# Patient Record
Sex: Female | Born: 1945 | Race: White | Hispanic: No | Smoking: Former smoker
Health system: Southern US, Community
[De-identification: ages and names within clinical notes are randomized; demographics above are authoritative.]

## PROBLEM LIST (undated history)

## (undated) DIAGNOSIS — M1990 Unspecified osteoarthritis, unspecified site: Secondary | ICD-10-CM

## (undated) DIAGNOSIS — E079 Disorder of thyroid, unspecified: Secondary | ICD-10-CM

## (undated) DIAGNOSIS — S72409A Unspecified fracture of lower end of unspecified femur, initial encounter for closed fracture: Secondary | ICD-10-CM

## (undated) DIAGNOSIS — J449 Chronic obstructive pulmonary disease, unspecified: Secondary | ICD-10-CM

## (undated) DIAGNOSIS — F419 Anxiety disorder, unspecified: Secondary | ICD-10-CM

## (undated) DIAGNOSIS — I10 Essential (primary) hypertension: Secondary | ICD-10-CM

## (undated) DIAGNOSIS — S72309A Unspecified fracture of shaft of unspecified femur, initial encounter for closed fracture: Secondary | ICD-10-CM

## (undated) DIAGNOSIS — K219 Gastro-esophageal reflux disease without esophagitis: Secondary | ICD-10-CM

## (undated) DIAGNOSIS — R51 Headache: Secondary | ICD-10-CM

## (undated) DIAGNOSIS — R0602 Shortness of breath: Secondary | ICD-10-CM

## (undated) DIAGNOSIS — E039 Hypothyroidism, unspecified: Secondary | ICD-10-CM

---

## 2001-01-01 ENCOUNTER — Emergency Department (HOSPITAL_COMMUNITY): Admission: EM | Admit: 2001-01-01 | Discharge: 2001-01-01 | Payer: Self-pay | Admitting: Emergency Medicine

## 2001-02-04 ENCOUNTER — Encounter: Admission: RE | Admit: 2001-02-04 | Discharge: 2001-02-04 | Payer: Self-pay | Admitting: Cardiovascular Disease

## 2001-02-04 ENCOUNTER — Encounter: Payer: Self-pay | Admitting: Cardiovascular Disease

## 2002-11-04 ENCOUNTER — Encounter: Admission: RE | Admit: 2002-11-04 | Discharge: 2002-11-04 | Payer: Self-pay

## 2004-09-20 ENCOUNTER — Ambulatory Visit: Payer: Self-pay | Admitting: Family Medicine

## 2005-01-22 ENCOUNTER — Ambulatory Visit: Payer: Self-pay | Admitting: Gastroenterology

## 2005-01-22 ENCOUNTER — Inpatient Hospital Stay (HOSPITAL_COMMUNITY): Admission: EM | Admit: 2005-01-22 | Discharge: 2005-01-25 | Payer: Self-pay | Admitting: Emergency Medicine

## 2005-02-07 ENCOUNTER — Ambulatory Visit: Payer: Self-pay | Admitting: Family Medicine

## 2005-02-09 ENCOUNTER — Ambulatory Visit: Payer: Self-pay | Admitting: Family Medicine

## 2005-02-15 ENCOUNTER — Ambulatory Visit: Payer: Self-pay | Admitting: Gastroenterology

## 2005-02-21 ENCOUNTER — Ambulatory Visit: Payer: Self-pay | Admitting: Family Medicine

## 2005-03-03 ENCOUNTER — Ambulatory Visit: Payer: Self-pay | Admitting: Gastroenterology

## 2005-05-04 ENCOUNTER — Ambulatory Visit: Payer: Self-pay | Admitting: Cardiology

## 2005-05-04 ENCOUNTER — Emergency Department (HOSPITAL_COMMUNITY): Admission: EM | Admit: 2005-05-04 | Discharge: 2005-05-04 | Payer: Self-pay | Admitting: Emergency Medicine

## 2005-05-04 ENCOUNTER — Ambulatory Visit: Payer: Self-pay | Admitting: Family Medicine

## 2005-05-05 ENCOUNTER — Ambulatory Visit: Payer: Self-pay | Admitting: Gastroenterology

## 2005-05-11 ENCOUNTER — Ambulatory Visit: Payer: Self-pay | Admitting: Cardiology

## 2005-05-16 ENCOUNTER — Ambulatory Visit: Payer: Self-pay | Admitting: Gastroenterology

## 2005-05-24 ENCOUNTER — Ambulatory Visit: Payer: Self-pay | Admitting: Cardiology

## 2005-06-16 ENCOUNTER — Ambulatory Visit: Payer: Self-pay | Admitting: Cardiology

## 2005-10-12 ENCOUNTER — Ambulatory Visit: Payer: Self-pay | Admitting: Family Medicine

## 2005-11-23 ENCOUNTER — Ambulatory Visit: Payer: Self-pay | Admitting: Family Medicine

## 2006-07-11 ENCOUNTER — Ambulatory Visit: Payer: Self-pay | Admitting: Family Medicine

## 2006-09-28 ENCOUNTER — Ambulatory Visit: Payer: Self-pay | Admitting: Family Medicine

## 2013-06-26 ENCOUNTER — Emergency Department (HOSPITAL_COMMUNITY): Payer: Medicare Other

## 2013-06-26 ENCOUNTER — Encounter (HOSPITAL_COMMUNITY): Payer: Self-pay | Admitting: Emergency Medicine

## 2013-06-26 ENCOUNTER — Inpatient Hospital Stay (HOSPITAL_COMMUNITY)
Admission: EM | Admit: 2013-06-26 | Discharge: 2013-07-01 | DRG: 481 | Disposition: A | Discharge status: Skilled Nursing Facility | Payer: Medicare Other | Attending: Internal Medicine | Admitting: Internal Medicine

## 2013-06-26 DIAGNOSIS — S72409S Unspecified fracture of lower end of unspecified femur, sequela: Secondary | ICD-10-CM

## 2013-06-26 DIAGNOSIS — Y92009 Unspecified place in unspecified non-institutional (private) residence as the place of occurrence of the external cause: Secondary | ICD-10-CM

## 2013-06-26 DIAGNOSIS — F39 Unspecified mood [affective] disorder: Secondary | ICD-10-CM | POA: Diagnosis present

## 2013-06-26 DIAGNOSIS — Y93E9 Activity, other interior property and clothing maintenance: Secondary | ICD-10-CM

## 2013-06-26 DIAGNOSIS — Z87891 Personal history of nicotine dependence: Secondary | ICD-10-CM

## 2013-06-26 DIAGNOSIS — Z880 Allergy status to penicillin: Secondary | ICD-10-CM

## 2013-06-26 DIAGNOSIS — W010XXA Fall on same level from slipping, tripping and stumbling without subsequent striking against object, initial encounter: Secondary | ICD-10-CM | POA: Diagnosis present

## 2013-06-26 DIAGNOSIS — M129 Arthropathy, unspecified: Secondary | ICD-10-CM | POA: Diagnosis present

## 2013-06-26 DIAGNOSIS — K219 Gastro-esophageal reflux disease without esophagitis: Secondary | ICD-10-CM | POA: Diagnosis present

## 2013-06-26 DIAGNOSIS — I10 Essential (primary) hypertension: Secondary | ICD-10-CM | POA: Diagnosis present

## 2013-06-26 DIAGNOSIS — S72309A Unspecified fracture of shaft of unspecified femur, initial encounter for closed fracture: Secondary | ICD-10-CM | POA: Diagnosis present

## 2013-06-26 DIAGNOSIS — Z79899 Other long term (current) drug therapy: Secondary | ICD-10-CM

## 2013-06-26 DIAGNOSIS — S7292XA Unspecified fracture of left femur, initial encounter for closed fracture: Secondary | ICD-10-CM

## 2013-06-26 DIAGNOSIS — D62 Acute posthemorrhagic anemia: Secondary | ICD-10-CM | POA: Diagnosis not present

## 2013-06-26 DIAGNOSIS — Z882 Allergy status to sulfonamides status: Secondary | ICD-10-CM

## 2013-06-26 DIAGNOSIS — M949 Disorder of cartilage, unspecified: Secondary | ICD-10-CM

## 2013-06-26 DIAGNOSIS — M899 Disorder of bone, unspecified: Secondary | ICD-10-CM | POA: Diagnosis present

## 2013-06-26 DIAGNOSIS — A498 Other bacterial infections of unspecified site: Secondary | ICD-10-CM | POA: Diagnosis present

## 2013-06-26 DIAGNOSIS — IMO0002 Reserved for concepts with insufficient information to code with codable children: Secondary | ICD-10-CM

## 2013-06-26 DIAGNOSIS — E039 Hypothyroidism, unspecified: Secondary | ICD-10-CM | POA: Diagnosis present

## 2013-06-26 DIAGNOSIS — N390 Urinary tract infection, site not specified: Secondary | ICD-10-CM | POA: Diagnosis present

## 2013-06-26 DIAGNOSIS — J449 Chronic obstructive pulmonary disease, unspecified: Secondary | ICD-10-CM

## 2013-06-26 DIAGNOSIS — I824Y9 Acute embolism and thrombosis of unspecified deep veins of unspecified proximal lower extremity: Secondary | ICD-10-CM | POA: Diagnosis present

## 2013-06-26 DIAGNOSIS — J4489 Other specified chronic obstructive pulmonary disease: Secondary | ICD-10-CM | POA: Diagnosis present

## 2013-06-26 DIAGNOSIS — S72409A Unspecified fracture of lower end of unspecified femur, initial encounter for closed fracture: Secondary | ICD-10-CM

## 2013-06-26 DIAGNOSIS — Z23 Encounter for immunization: Secondary | ICD-10-CM

## 2013-06-26 DIAGNOSIS — S72453A Displaced supracondylar fracture without intracondylar extension of lower end of unspecified femur, initial encounter for closed fracture: Principal | ICD-10-CM | POA: Diagnosis present

## 2013-06-26 HISTORY — DX: Anxiety disorder, unspecified: F41.9

## 2013-06-26 HISTORY — DX: Gastro-esophageal reflux disease without esophagitis: K21.9

## 2013-06-26 HISTORY — DX: Unspecified fracture of lower end of unspecified femur, initial encounter for closed fracture: S72.409A

## 2013-06-26 HISTORY — DX: Unspecified fracture of shaft of unspecified femur, initial encounter for closed fracture: S72.309A

## 2013-06-26 HISTORY — DX: Disorder of thyroid, unspecified: E07.9

## 2013-06-26 HISTORY — DX: Headache: R51

## 2013-06-26 HISTORY — DX: Essential (primary) hypertension: I10

## 2013-06-26 HISTORY — DX: Unspecified osteoarthritis, unspecified site: M19.90

## 2013-06-26 HISTORY — DX: Chronic obstructive pulmonary disease, unspecified: J44.9

## 2013-06-26 HISTORY — DX: Hypothyroidism, unspecified: E03.9

## 2013-06-26 HISTORY — DX: Shortness of breath: R06.02

## 2013-06-26 MED ORDER — MORPHINE SULFATE 4 MG/ML IJ SOLN
4.0000 mg | INTRAMUSCULAR | Status: DC | PRN
  Filled 2013-06-26: qty 1

## 2013-06-26 MED ORDER — ONDANSETRON HCL 4 MG/2ML IJ SOLN
4.0000 mg | Freq: Once | INTRAMUSCULAR | Status: AC
  Administered 2013-06-26: 4 mg via INTRAVENOUS

## 2013-06-26 MED ORDER — ONDANSETRON HCL 4 MG/2ML IJ SOLN
4.0000 mg | Freq: Once | INTRAMUSCULAR | Status: DC
Start: 2013-06-26 — End: 2013-07-01
  Filled 2013-06-26: qty 2

## 2013-06-26 MED ORDER — ONDANSETRON HCL 4 MG/2ML IJ SOLN
4.0000 mg | Freq: Once | INTRAMUSCULAR | Status: AC
  Filled 2013-06-26: qty 2

## 2013-06-26 MED ORDER — ONDANSETRON HCL 4 MG/2ML IJ SOLN: 4.0000 mg | Freq: Four times a day (QID) | INTRAMUSCULAR | Status: DC | PRN

## 2013-06-26 NOTE — ED Provider Notes (Signed)
CSN: [id number]     Arrival date & time 06/26/13  2254 History   First MD Initiated Contact with Patient 06/26/13 2308     Chief Complaint  Patient presents with  . Knee Injury   HPI  History provided by the patient and significant other. Patient is a 68 year old female with history of hypertension and COPD who presents with left knee and leg pain and injury. Patient states that she was trying to clean up the mess in the home when she tripped and fell forward landing on her left knee. Since that time she has had significant pain and swelling to her knee. EMS was called and patient was transferred and given 4 mg of morphine. She did become nauseous with episode of vomiting following the medication and was given a dose of Zofran. She denies any other injury from the fall. No upper extremity injuries. No head injury or LOC. No back or neck pains. Denies any weakness or numbness in the foot or toes. Accident occurred around 10:30 PM.    Past Medical History  Diagnosis Date  . Hypertension   . Thyroid disease   . COPD (chronic obstructive pulmonary disease)    History reviewed. No pertinent past surgical history. History reviewed. No pertinent family history. History  Substance Use Topics  . Smoking status: Former Games developer  . Smokeless tobacco: Not on file  . Alcohol Use: No   OB History   Grav Para Term Preterm Abortions TAB SAB Ect Mult Living                 Review of Systems  All other systems reviewed and are negative.    Allergies  Penicillins and Sulfa antibiotics  Home Medications   Current Outpatient Rx  Name  Route  Sig  Dispense  Refill  . levothyroxine (SYNTHROID, LEVOTHROID) 25 MCG tablet   Oral   Take 25 mcg by mouth daily before breakfast.         . nebivolol (BYSTOLIC) 5 MG tablet   Oral   Take 2.5 mg by mouth daily.          . predniSONE (DELTASONE) 5 MG tablet   Oral   Take 5 mg by mouth daily with breakfast.         . sertraline (ZOLOFT) 50 MG  tablet   Oral   Take 50 mg by mouth daily.         Marland Kitchen tiotropium (SPIRIVA) 18 MCG inhalation capsule   Inhalation   Place 18 mcg into inhaler and inhale daily.          BP 150/89  Pulse 65  Temp(Src) 97.7 F (36.5 C) (Oral)  Resp 14  Ht 5\' 5"  (1.651 m)  Wt 140 lb (63.504 kg)  BMI 23.30 kg/m2  SpO2 96% Physical Exam  Nursing note and vitals reviewed. Constitutional: She is oriented to person, place, and time. She appears well-developed and well-nourished. No distress.  HENT:  Head: Normocephalic and atraumatic.  Neck: Normal range of motion. Neck supple.  Cardiovascular: Normal rate and regular rhythm.   Pulmonary/Chest: Effort normal and breath sounds normal. No respiratory distress. She has no wheezes. She has no rales.  Musculoskeletal:  Significant swelling around the left knee with pain and tenderness. Patient unable to have range of motion secondary to pain and discomfort. She has normal movements in the foot and ankle. Dorsal pedal pulses are difficult to palpate manually. Good capillary refill less than 2 seconds.  No pain  or tenderness to palpation of the left hip area.  Neurological: She is alert and oriented to person, place, and time.  Skin: Skin is warm and dry. No rash noted.  Psychiatric: She has a normal mood and affect. Her behavior is normal.    ED Course  Procedures   DIAGNOSTIC STUDIES: Oxygen Saturation is 96% on room air.    COORDINATION OF CARE:  Nursing notes reviewed. Vital signs reviewed. Initial pt interview and examination performed.   11:49 PM-patient seen and evaluated. She appears comfortable at this time no acute distress. Does complain of slight nausea.   X-rays reviewed showing fracture of the distal left femur. Will obtain preop tests as well as x-rays of the left hip. Patient discussed with attending physician who agrees with plan.  1:00 AM spoke with Dr. Eulah PontMurphy on call for orthopedics. Would like the leg straightened to reduce  the fracture as much as possible and patient placed in knee immobilizer. He'll plan on surgery later in the day. Would like hospitalist to admit. Patient to be n.p.o.  1:20 AM spoke with [facility] hospitalist. They will see patient and admit. Would like old and orders for a MedSurg bed. Patient's chest x-ray shows possibilities of infiltrate. Patient without any change cough, fever or congestion.  Results for orders placed during the hospital encounter of 06/26/13  CBC WITH DIFFERENTIAL      Result Value Range   WBC 9.7  4.0 - 10.5 K/uL   RBC 3.75 (*) 3.87 - 5.11 MIL/uL   Hemoglobin 13.2  12.0 - 15.0 g/dL   HCT 45.438.6  09.836.0 - 11.946.0 %   MCV 102.9 (*) 78.0 - 100.0 fL   MCH 35.2 (*) 26.0 - 34.0 pg   MCHC 34.2  30.0 - 36.0 g/dL   RDW 14.713.2  82.911.5 - 56.215.5 %   Platelets 183  150 - 400 K/uL   Neutrophils Relative % 84 (*) 43 - 77 %   Neutro Abs 8.1 (*) 1.7 - 7.7 K/uL   Lymphocytes Relative 11 (*) 12 - 46 %   Lymphs Abs 1.0  0.7 - 4.0 K/uL   Monocytes Relative 5  3 - 12 %   Monocytes Absolute 0.5  0.1 - 1.0 K/uL   Eosinophils Relative 1  0 - 5 %   Eosinophils Absolute 0.1  0.0 - 0.7 K/uL   Basophils Relative 0  0 - 1 %   Basophils Absolute 0.0  0.0 - 0.1 K/uL  BASIC METABOLIC PANEL      Result Value Range   Sodium 139  137 - 147 mEq/L   Potassium 4.3  3.7 - 5.3 mEq/L   Chloride 101  96 - 112 mEq/L   CO2 25  19 - 32 mEq/L   Glucose, Bld 126 (*) 70 - 99 mg/dL   BUN 21  6 - 23 mg/dL   Creatinine, Ser 1.300.99  0.50 - 1.10 mg/dL   Calcium 9.5  8.4 - 86.510.5 mg/dL   GFR calc non Af Amer 58 (*) >90 mL/min   GFR calc Af Amer 67 (*) >90 mL/min  PROTIME-INR      Result Value Range   Prothrombin Time 13.3  11.6 - 15.2 seconds   INR 1.03  0.00 - 1.49  APTT      Result Value Range   aPTT 23 (*) 24 - 37 seconds  TYPE AND SCREEN      Result Value Range   ABO/RH(D) A POS     Antibody  Screen NEG     Sample Expiration 06/30/2013        Imaging Review Dg Chest Portable 1 View  06/27/2013   CLINICAL DATA:   Fall with femur fracture.  EXAM: PORTABLE CHEST - 1 VIEW  COMPARISON:  05/04/2005  FINDINGS: Normal heart size and pulmonary vascularity. There appears to be infiltration or atelectasis in the left lung base obscuring the left hemidiaphragm. Pneumonia may be present. Pleural thickening in the left apex. 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  IMPRESSION: Focal infiltration in the left lung base may represent pneumonia.   Electronically Signed   By: Burman Nieves M.D.   On: 06/27/2013 00:46   Dg Hip Portable 1 View Left  06/27/2013   CLINICAL DATA:  Fall.  Distal femur fracture.  EXAM: PORTABLE LEFT HIP - 1 VIEW  COMPARISON:  None.  FINDINGS: No evidence of acute fracture or dislocation of the left hip on single view.  IMPRESSION: Negative.   Electronically Signed   By: Burman Nieves M.D.   On: 06/27/2013 00:47   Dg Knee Complete 4 Views Left  06/26/2013   CLINICAL DATA:  History of trauma from a fall.  Leg pain.  EXAM: LEFT KNEE - COMPLETE 4+ VIEW  COMPARISON:  No priors.  FINDINGS: Multiple views of the left knee demonstrate a highly comminuted fracture of the distal femoral metadiaphysis. There is approximately 2 cm of lateral displacement and approximately 15 degrees of dorsal angulation. The knee joint itself appears intact. Overlying soft tissues appear swollen.  IMPRESSION: 1. Severely comminuted displaced and mildly angulated fracture of the distal femoral metadiaphysis, as above.   Electronically Signed   By: Trudie Reed M.D.   On: 06/26/2013 23:51    EKG Interpretation   None       MDM   1. Femur fracture, left, closed, initial encounter        Angus Seller, PA-C 06/27/13 0128

## 2013-06-26 NOTE — ED Notes (Signed)
Patient from home, fell at home over a broom.  Patient now with left knee pain.  Patient is unable to move leg without pain.  No hip involvement.  Patient was given 4mg  morphine enroute along with 4mg  of Zofran due to nausea and vomiting.

## 2013-06-26 NOTE — ED Notes (Signed)
Patient transported to XR. 

## 2013-06-27 ENCOUNTER — Emergency Department (HOSPITAL_COMMUNITY): Payer: Medicare Other

## 2013-06-27 ENCOUNTER — Inpatient Hospital Stay (HOSPITAL_COMMUNITY): Payer: Medicare Other

## 2013-06-27 ENCOUNTER — Encounter (HOSPITAL_COMMUNITY)
Admission: EM | Disposition: A | Discharge status: Skilled Nursing Facility | Payer: Self-pay | Source: Home / Self Care | Attending: Internal Medicine

## 2013-06-27 ENCOUNTER — Encounter (HOSPITAL_COMMUNITY): Payer: Self-pay | Admitting: Critical Care Medicine

## 2013-06-27 ENCOUNTER — Encounter (HOSPITAL_COMMUNITY): Payer: Medicare Other | Admitting: Critical Care Medicine

## 2013-06-27 ENCOUNTER — Inpatient Hospital Stay (HOSPITAL_COMMUNITY): Payer: Medicare Other | Admitting: Critical Care Medicine

## 2013-06-27 DIAGNOSIS — I824Y9 Acute embolism and thrombosis of unspecified deep veins of unspecified proximal lower extremity: Secondary | ICD-10-CM

## 2013-06-27 DIAGNOSIS — I519 Heart disease, unspecified: Secondary | ICD-10-CM

## 2013-06-27 DIAGNOSIS — F39 Unspecified mood [affective] disorder: Secondary | ICD-10-CM | POA: Diagnosis present

## 2013-06-27 DIAGNOSIS — J4489 Other specified chronic obstructive pulmonary disease: Secondary | ICD-10-CM

## 2013-06-27 DIAGNOSIS — E039 Hypothyroidism, unspecified: Secondary | ICD-10-CM | POA: Diagnosis present

## 2013-06-27 DIAGNOSIS — J449 Chronic obstructive pulmonary disease, unspecified: Secondary | ICD-10-CM

## 2013-06-27 DIAGNOSIS — IMO0002 Reserved for concepts with insufficient information to code with codable children: Secondary | ICD-10-CM

## 2013-06-27 DIAGNOSIS — I10 Essential (primary) hypertension: Secondary | ICD-10-CM | POA: Diagnosis present

## 2013-06-27 DIAGNOSIS — S72309A Unspecified fracture of shaft of unspecified femur, initial encounter for closed fracture: Secondary | ICD-10-CM

## 2013-06-27 DIAGNOSIS — M7989 Other specified soft tissue disorders: Secondary | ICD-10-CM

## 2013-06-27 DIAGNOSIS — S7290XA Unspecified fracture of unspecified femur, initial encounter for closed fracture: Secondary | ICD-10-CM

## 2013-06-27 DIAGNOSIS — S72409A Unspecified fracture of lower end of unspecified femur, initial encounter for closed fracture: Secondary | ICD-10-CM

## 2013-06-27 HISTORY — PX: FEMUR IM NAIL: SHX1597

## 2013-06-27 HISTORY — DX: Unspecified fracture of lower end of unspecified femur, initial encounter for closed fracture: S72.409A

## 2013-06-27 HISTORY — DX: Unspecified fracture of shaft of unspecified femur, initial encounter for closed fracture: S72.309A

## 2013-06-27 LAB — CBC
HCT: 33.4 % — ABNORMAL LOW (ref 36.0–46.0)
HEMOGLOBIN: 11.1 g/dL — AB (ref 12.0–15.0)
MCH: 34.6 pg — ABNORMAL HIGH (ref 26.0–34.0)
MCHC: 33.2 g/dL (ref 30.0–36.0)
MCV: 104 fL — ABNORMAL HIGH (ref 78.0–100.0)
Platelets: 164 10*3/uL (ref 150–400)
RBC: 3.21 MIL/uL — AB (ref 3.87–5.11)
RDW: 13.4 % (ref 11.5–15.5)
WBC: 13.1 10*3/uL — AB (ref 4.0–10.5)

## 2013-06-27 LAB — CBC WITH DIFFERENTIAL/PLATELET
BASOS ABS: 0 10*3/uL (ref 0.0–0.1)
BASOS PCT: 0 % (ref 0–1)
Eosinophils Absolute: 0.1 10*3/uL (ref 0.0–0.7)
Eosinophils Relative: 1 % (ref 0–5)
HEMATOCRIT: 38.6 % (ref 36.0–46.0)
HEMOGLOBIN: 13.2 g/dL (ref 12.0–15.0)
LYMPHS PCT: 11 % — AB (ref 12–46)
Lymphs Abs: 1 10*3/uL (ref 0.7–4.0)
MCH: 35.2 pg — ABNORMAL HIGH (ref 26.0–34.0)
MCHC: 34.2 g/dL (ref 30.0–36.0)
MCV: 102.9 fL — ABNORMAL HIGH (ref 78.0–100.0)
MONOS PCT: 5 % (ref 3–12)
Monocytes Absolute: 0.5 10*3/uL (ref 0.1–1.0)
NEUTROS ABS: 8.1 10*3/uL — AB (ref 1.7–7.7)
Neutrophils Relative %: 84 % — ABNORMAL HIGH (ref 43–77)
Platelets: 183 10*3/uL (ref 150–400)
RBC: 3.75 MIL/uL — AB (ref 3.87–5.11)
RDW: 13.2 % (ref 11.5–15.5)
WBC: 9.7 10*3/uL (ref 4.0–10.5)

## 2013-06-27 LAB — BASIC METABOLIC PANEL
BUN: 21 mg/dL (ref 6–23)
CHLORIDE: 101 meq/L (ref 96–112)
CO2: 25 mEq/L (ref 19–32)
Calcium: 9.5 mg/dL (ref 8.4–10.5)
Creatinine, Ser: 0.99 mg/dL (ref 0.50–1.10)
GFR calc Af Amer: 67 mL/min — ABNORMAL LOW (ref >90)
GFR calc non Af Amer: 58 mL/min — ABNORMAL LOW (ref >90)
Glucose, Bld: 126 mg/dL — ABNORMAL HIGH (ref 70–99)
POTASSIUM: 4.3 meq/L (ref 3.7–5.3)
Sodium: 139 mEq/L (ref 137–147)

## 2013-06-27 LAB — CREATININE, SERUM
Creatinine, Ser: 1.06 mg/dL (ref 0.50–1.10)
GFR, EST AFRICAN AMERICAN: 62 mL/min — AB (ref >90)
GFR, EST NON AFRICAN AMERICAN: 53 mL/min — AB (ref >90)

## 2013-06-27 LAB — APTT: APTT: 23 s — AB (ref 24–37)

## 2013-06-27 LAB — TYPE AND SCREEN
ABO/RH(D): A POS
Antibody Screen: NEGATIVE

## 2013-06-27 LAB — PROTIME-INR
INR: 1.03 (ref 0.00–1.49)
PROTHROMBIN TIME: 13.3 s (ref 11.6–15.2)

## 2013-06-27 LAB — SURGICAL PCR SCREEN
MRSA, PCR: NEGATIVE
Staphylococcus aureus: NEGATIVE

## 2013-06-27 LAB — ABO/RH: ABO/RH(D): A POS

## 2013-06-27 SURGERY — INSERTION, INTRAMEDULLARY ROD, FEMUR, RETROGRADE
Anesthesia: General | Site: Leg Upper | Laterality: Left

## 2013-06-27 MED ORDER — ACETAMINOPHEN 325 MG PO TABS
650.0000 mg | ORAL_TABLET | Freq: Four times a day (QID) | ORAL | Status: DC | PRN
  Administered 2013-06-28 – 2013-07-01 (×9): 650 mg via ORAL
  Filled 2013-06-27 (×9): qty 2

## 2013-06-27 MED ORDER — POLYETHYLENE GLYCOL 3350 17 G PO PACK: 17.0000 g | PACK | Freq: Every day | ORAL | Status: DC | PRN

## 2013-06-27 MED ORDER — ALUM & MAG HYDROXIDE-SIMETH 200-200-20 MG/5ML PO SUSP: 30.0000 mL | ORAL | Status: DC | PRN

## 2013-06-27 MED ORDER — LIDOCAINE HCL 4 % MT SOLN
OROMUCOSAL | Status: DC | PRN
  Administered 2013-06-27: 5 mL via TOPICAL

## 2013-06-27 MED ORDER — TIOTROPIUM BROMIDE MONOHYDRATE 18 MCG IN CAPS
18.0000 ug | ORAL_CAPSULE | Freq: Every day | RESPIRATORY_TRACT | Status: DC
  Administered 2013-06-27 – 2013-07-01 (×4): 18 ug via RESPIRATORY_TRACT
  Filled 2013-06-27: qty 5

## 2013-06-27 MED ORDER — ROCURONIUM BROMIDE 100 MG/10ML IV SOLN
INTRAVENOUS | Status: DC | PRN
  Administered 2013-06-27: 30 mg via INTRAVENOUS

## 2013-06-27 MED ORDER — ACETAMINOPHEN 650 MG RE SUPP: 650.0000 mg | Freq: Four times a day (QID) | RECTAL | Status: DC | PRN

## 2013-06-27 MED ORDER — DOCUSATE SODIUM 100 MG PO CAPS
100.0000 mg | ORAL_CAPSULE | Freq: Two times a day (BID) | ORAL | Status: DC
  Administered 2013-06-27 – 2013-06-30 (×6): 100 mg via ORAL
  Filled 2013-06-27 (×9): qty 1

## 2013-06-27 MED ORDER — ONDANSETRON HCL 4 MG PO TABS: 4.0000 mg | ORAL_TABLET | Freq: Four times a day (QID) | ORAL | Status: DC | PRN

## 2013-06-27 MED ORDER — ONDANSETRON HCL 4 MG/2ML IJ SOLN: 4.0000 mg | Freq: Once | INTRAMUSCULAR | Status: DC | PRN

## 2013-06-27 MED ORDER — ARTIFICIAL TEARS OP OINT
TOPICAL_OINTMENT | OPHTHALMIC | Status: DC | PRN
  Administered 2013-06-27: 1 via OPHTHALMIC

## 2013-06-27 MED ORDER — TIOTROPIUM BROMIDE MONOHYDRATE 18 MCG IN CAPS
18.0000 ug | ORAL_CAPSULE | Freq: Every day | RESPIRATORY_TRACT | Status: DC
  Filled 2013-06-27: qty 5

## 2013-06-27 MED ORDER — SERTRALINE HCL 50 MG PO TABS
50.0000 mg | ORAL_TABLET | Freq: Every day | ORAL | Status: DC
  Administered 2013-06-28 – 2013-07-01 (×4): 50 mg via ORAL
  Filled 2013-06-27 (×4): qty 1

## 2013-06-27 MED ORDER — SENNA-DOCUSATE SODIUM 8.6-50 MG PO TABS: 2.0000 | ORAL_TABLET | Freq: Every day | ORAL | Status: AC

## 2013-06-27 MED ORDER — LACTATED RINGERS IV SOLN
INTRAVENOUS | Status: DC | PRN
  Administered 2013-06-27 (×2): via INTRAVENOUS

## 2013-06-27 MED ORDER — PHENYLEPHRINE HCL 10 MG/ML IJ SOLN
INTRAMUSCULAR | Status: DC | PRN
  Administered 2013-06-27: 80 ug via INTRAVENOUS
  Administered 2013-06-27: 200 ug via INTRAVENOUS
  Administered 2013-06-27 (×2): 80 ug via INTRAVENOUS

## 2013-06-27 MED ORDER — METOCLOPRAMIDE HCL 5 MG/ML IJ SOLN
10.0000 mg | Freq: Once | INTRAMUSCULAR | Status: AC
  Administered 2013-06-27: 10 mg via INTRAVENOUS
  Filled 2013-06-27: qty 2

## 2013-06-27 MED ORDER — SENNA 8.6 MG PO TABS
1.0000 | ORAL_TABLET | Freq: Two times a day (BID) | ORAL | Status: DC
  Administered 2013-06-27 – 2013-06-30 (×6): 8.6 mg via ORAL
  Filled 2013-06-27 (×9): qty 1

## 2013-06-27 MED ORDER — ENOXAPARIN SODIUM 40 MG/0.4ML ~~LOC~~ SOLN
40.0000 mg | SUBCUTANEOUS | Status: DC
  Filled 2013-06-27: qty 0.4

## 2013-06-27 MED ORDER — FENTANYL CITRATE 0.05 MG/ML IJ SOLN
INTRAMUSCULAR | Status: DC | PRN
  Administered 2013-06-27 (×5): 50 ug via INTRAVENOUS

## 2013-06-27 MED ORDER — ACETAMINOPHEN 500 MG PO TABS
1000.0000 mg | ORAL_TABLET | Freq: Once | ORAL | Status: AC
Start: 2013-06-27 — End: 2013-06-27
  Administered 2013-06-27: 1000 mg via ORAL
  Filled 2013-06-27: qty 2

## 2013-06-27 MED ORDER — LIDOCAINE HCL (CARDIAC) 20 MG/ML IV SOLN
INTRAVENOUS | Status: DC | PRN
  Administered 2013-06-27: 80 mg via INTRAVENOUS

## 2013-06-27 MED ORDER — CLINDAMYCIN PHOSPHATE 600 MG/50ML IV SOLN
600.0000 mg | Freq: Four times a day (QID) | INTRAVENOUS | Status: AC
  Administered 2013-06-27 (×2): 600 mg via INTRAVENOUS
  Filled 2013-06-27 (×2): qty 50

## 2013-06-27 MED ORDER — PROPOFOL 10 MG/ML IV BOLUS
INTRAVENOUS | Status: DC | PRN
  Administered 2013-06-27: 100 mg via INTRAVENOUS

## 2013-06-27 MED ORDER — HYDROMORPHONE HCL PF 1 MG/ML IJ SOLN: 0.2500 mg | INTRAMUSCULAR | Status: DC | PRN

## 2013-06-27 MED ORDER — GLYCOPYRROLATE 0.2 MG/ML IJ SOLN
INTRAMUSCULAR | Status: DC | PRN
  Administered 2013-06-27: 0.4 mg via INTRAVENOUS

## 2013-06-27 MED ORDER — ALBUTEROL SULFATE HFA 108 (90 BASE) MCG/ACT IN AERS
INHALATION_SPRAY | RESPIRATORY_TRACT | Status: DC | PRN
  Administered 2013-06-27: 4 via RESPIRATORY_TRACT
  Administered 2013-06-27: 2 via RESPIRATORY_TRACT

## 2013-06-27 MED ORDER — NEBIVOLOL HCL 2.5 MG PO TABS
2.5000 mg | ORAL_TABLET | Freq: Every day | ORAL | Status: DC
  Administered 2013-06-28 – 2013-07-01 (×4): 2.5 mg via ORAL
  Filled 2013-06-27 (×4): qty 1

## 2013-06-27 MED ORDER — CLINDAMYCIN PHOSPHATE 900 MG/50ML IV SOLN
900.0000 mg | INTRAVENOUS | Status: AC
  Administered 2013-06-27: 900 mg via INTRAVENOUS
  Filled 2013-06-27: qty 50

## 2013-06-27 MED ORDER — ONDANSETRON 8 MG/NS 50 ML IVPB
8.0000 mg | Freq: Four times a day (QID) | INTRAVENOUS | Status: DC | PRN
  Filled 2013-06-27: qty 8

## 2013-06-27 MED ORDER — PANTOPRAZOLE SODIUM 40 MG IV SOLR
40.0000 mg | INTRAVENOUS | Status: DC
  Administered 2013-06-27 – 2013-06-29 (×3): 40 mg via INTRAVENOUS
  Filled 2013-06-27 (×4): qty 40

## 2013-06-27 MED ORDER — ONDANSETRON HCL 4 MG/2ML IJ SOLN
4.0000 mg | Freq: Four times a day (QID) | INTRAMUSCULAR | Status: DC | PRN
Start: 2013-06-27 — End: 2013-07-01
  Administered 2013-06-27: 4 mg via INTRAVENOUS
  Filled 2013-06-27: qty 2

## 2013-06-27 MED ORDER — METOCLOPRAMIDE HCL 5 MG PO TABS
5.0000 mg | ORAL_TABLET | Freq: Three times a day (TID) | ORAL | Status: DC | PRN
  Filled 2013-06-27: qty 2

## 2013-06-27 MED ORDER — 0.9 % SODIUM CHLORIDE (POUR BTL) OPTIME
TOPICAL | Status: DC | PRN
  Administered 2013-06-27: 1000 mL

## 2013-06-27 MED ORDER — BISACODYL 10 MG RE SUPP: 10.0000 mg | Freq: Every day | RECTAL | Status: DC | PRN

## 2013-06-27 MED ORDER — ONDANSETRON HCL 4 MG/2ML IJ SOLN
INTRAMUSCULAR | Status: DC | PRN
  Administered 2013-06-27 (×2): 4 mg via INTRAVENOUS

## 2013-06-27 MED ORDER — HYDROCODONE-ACETAMINOPHEN 10-325 MG PO TABS: 1.0000 | ORAL_TABLET | Freq: Four times a day (QID) | ORAL | Status: AC | PRN

## 2013-06-27 MED ORDER — DEXTROSE-NACL 5-0.45 % IV SOLN
100.0000 mL/h | INTRAVENOUS | Status: DC
  Administered 2013-06-27: 100 mL/h via INTRAVENOUS

## 2013-06-27 MED ORDER — DEXAMETHASONE SODIUM PHOSPHATE 4 MG/ML IJ SOLN
INTRAMUSCULAR | Status: DC | PRN
  Administered 2013-06-27: 4 mg via INTRAVENOUS

## 2013-06-27 MED ORDER — ENOXAPARIN SODIUM 40 MG/0.4ML ~~LOC~~ SOLN: 40.0000 mg | SUBCUTANEOUS | Status: DC

## 2013-06-27 MED ORDER — EPHEDRINE SULFATE 50 MG/ML IJ SOLN
INTRAMUSCULAR | Status: DC | PRN
  Administered 2013-06-27: 10 mg via INTRAVENOUS
  Administered 2013-06-27 (×2): 5 mg via INTRAVENOUS
  Administered 2013-06-27: 10 mg via INTRAVENOUS

## 2013-06-27 MED ORDER — NEOSTIGMINE METHYLSULFATE 1 MG/ML IJ SOLN
INTRAMUSCULAR | Status: DC | PRN
  Administered 2013-06-27: 3 mg via INTRAVENOUS

## 2013-06-27 MED ORDER — MAGNESIUM CITRATE PO SOLN: 1.0000 | Freq: Once | ORAL | Status: AC | PRN

## 2013-06-27 MED ORDER — HYDROCODONE-ACETAMINOPHEN 5-325 MG PO TABS: 1.0000 | ORAL_TABLET | Freq: Four times a day (QID) | ORAL | Status: DC | PRN

## 2013-06-27 MED ORDER — ALBUTEROL SULFATE HFA 108 (90 BASE) MCG/ACT IN AERS
1.0000 | INHALATION_SPRAY | Freq: Four times a day (QID) | RESPIRATORY_TRACT | Status: DC
Start: 2013-06-27 — End: 2013-06-28
  Administered 2013-06-27: 2 via RESPIRATORY_TRACT
  Filled 2013-06-27: qty 6.7

## 2013-06-27 MED ORDER — MORPHINE SULFATE 2 MG/ML IJ SOLN: 0.5000 mg | INTRAMUSCULAR | Status: DC | PRN

## 2013-06-27 MED ORDER — LEVOTHYROXINE SODIUM 25 MCG PO TABS
25.0000 ug | ORAL_TABLET | Freq: Every day | ORAL | Status: DC
  Administered 2013-06-28 – 2013-07-01 (×4): 25 ug via ORAL
  Filled 2013-06-27 (×5): qty 1

## 2013-06-27 MED ORDER — CHLORHEXIDINE GLUCONATE 4 % EX LIQD
60.0000 mL | Freq: Once | CUTANEOUS | Status: AC
  Administered 2013-06-27: 4 via TOPICAL
  Filled 2013-06-27: qty 60

## 2013-06-27 MED ORDER — PREDNISONE 5 MG PO TABS: 5.0000 mg | ORAL_TABLET | Freq: Every day | ORAL | Status: DC

## 2013-06-27 MED ORDER — MENTHOL 3 MG MT LOZG: 1.0000 | LOZENGE | OROMUCOSAL | Status: DC | PRN

## 2013-06-27 MED ORDER — POTASSIUM CHLORIDE IN NACL 20-0.45 MEQ/L-% IV SOLN
INTRAVENOUS | Status: DC
  Administered 2013-06-27 – 2013-06-28 (×2): via INTRAVENOUS
  Filled 2013-06-27 (×7): qty 1000

## 2013-06-27 MED ORDER — PNEUMOCOCCAL VAC POLYVALENT 25 MCG/0.5ML IJ INJ
0.5000 mL | INJECTION | INTRAMUSCULAR | Status: AC
  Administered 2013-07-01: 0.5 mL via INTRAMUSCULAR
  Filled 2013-06-27: qty 0.5

## 2013-06-27 MED ORDER — METOCLOPRAMIDE HCL 5 MG/ML IJ SOLN: 5.0000 mg | Freq: Three times a day (TID) | INTRAMUSCULAR | Status: DC | PRN

## 2013-06-27 MED ORDER — METOCLOPRAMIDE HCL 5 MG/ML IJ SOLN
INTRAMUSCULAR | Status: DC | PRN
  Administered 2013-06-27: 10 mg via INTRAVENOUS

## 2013-06-27 MED ORDER — ENOXAPARIN SODIUM 60 MG/0.6ML ~~LOC~~ SOLN
60.0000 mg | Freq: Two times a day (BID) | SUBCUTANEOUS | Status: DC
  Administered 2013-06-27 – 2013-06-30 (×6): 60 mg via SUBCUTANEOUS
  Filled 2013-06-27 (×8): qty 0.6

## 2013-06-27 MED ORDER — HYDROCORTISONE SOD SUCCINATE 100 MG IJ SOLR
INTRAMUSCULAR | Status: AC
  Administered 2013-06-27: 10:00:00 50 mg via INTRAVENOUS
  Filled 2013-06-27: qty 2

## 2013-06-27 MED ORDER — PHENOL 1.4 % MT LIQD: 1.0000 | OROMUCOSAL | Status: DC | PRN

## 2013-06-27 MED ORDER — HYDROCORTISONE SOD SUCCINATE 100 MG IJ SOLR
50.0000 mg | Freq: Three times a day (TID) | INTRAMUSCULAR | Status: DC
  Administered 2013-06-27: 03:00:00 via INTRAVENOUS
  Administered 2013-06-27 – 2013-06-30 (×9): 50 mg via INTRAVENOUS
  Filled 2013-06-27 (×12): qty 1

## 2013-06-27 SURGICAL SUPPLY — 76 items
APL SKNCLS STERI-STRIP NONHPOA (GAUZE/BANDAGES/DRESSINGS) ×1
BANDAGE ELASTIC 4 VELCRO ST LF (GAUZE/BANDAGES/DRESSINGS) ×3 IMPLANT
BANDAGE ELASTIC 6 VELCRO ST LF (GAUZE/BANDAGES/DRESSINGS) ×3 IMPLANT
BANDAGE ESMARK 6X9 LF (GAUZE/BANDAGES/DRESSINGS) IMPLANT
BANDAGE GAUZE ELAST BULKY 4 IN (GAUZE/BANDAGES/DRESSINGS) ×3 IMPLANT
BENZOIN TINCTURE PRP APPL 2/3 (GAUZE/BANDAGES/DRESSINGS) ×2 IMPLANT
BIT DRILL CALIBRATED 4.3MMX365 (DRILL) IMPLANT
BIT DRILL CROWE PNT TWST 4.5MM (DRILL) IMPLANT
BLADE SURG 15 STRL LF DISP TIS (BLADE) ×1 IMPLANT
BLADE SURG 15 STRL SS (BLADE)
BLADE SURG ROTATE 9660 (MISCELLANEOUS) IMPLANT
BNDG CMPR 9X6 STRL LF SNTH (GAUZE/BANDAGES/DRESSINGS)
BNDG CMPR MED 15X6 ELC VLCR LF (GAUZE/BANDAGES/DRESSINGS) ×1
BNDG COHESIVE 6X5 TAN STRL LF (GAUZE/BANDAGES/DRESSINGS) ×3 IMPLANT
BNDG ELASTIC 6X15 VLCR STRL LF (GAUZE/BANDAGES/DRESSINGS) ×2 IMPLANT
BNDG ESMARK 6X9 LF (GAUZE/BANDAGES/DRESSINGS)
BOOTCOVER CLEANROOM LRG (PROTECTIVE WEAR) ×6 IMPLANT
CANISTER SUCTION 2500CC (MISCELLANEOUS) ×2 IMPLANT
CLOSURE WOUND 1/2 X4 (GAUZE/BANDAGES/DRESSINGS) ×2
CLOTH BEACON ORANGE TIMEOUT ST (SAFETY) ×1 IMPLANT
COVER SURGICAL LIGHT HANDLE (MISCELLANEOUS) ×4 IMPLANT
CUFF TOURNIQUET SINGLE 34IN LL (TOURNIQUET CUFF) IMPLANT
CUFF TOURNIQUET SINGLE 44IN (TOURNIQUET CUFF) IMPLANT
DRAPE C-ARM 42X72 X-RAY (DRAPES) ×3 IMPLANT
DRAPE C-ARMOR (DRAPES) ×2 IMPLANT
DRAPE INCISE IOBAN 66X45 STRL (DRAPES) ×2 IMPLANT
DRAPE ORTHO SPLIT 77X108 STRL (DRAPES) ×9
DRAPE PROXIMA HALF (DRAPES) ×10 IMPLANT
DRAPE SURG ORHT 6 SPLT 77X108 (DRAPES) ×2 IMPLANT
DRAPE U-SHAPE 47X51 STRL (DRAPES) ×3 IMPLANT
DRILL CALIBRATED 4.3MMX365 (DRILL) ×3
DRILL CROWE POINT TWIST 4.5MM (DRILL) ×3
DRSG ADAPTIC 3X8 NADH LF (GAUZE/BANDAGES/DRESSINGS) ×3 IMPLANT
DURAPREP 26ML APPLICATOR (WOUND CARE) ×5 IMPLANT
DURAPREP 6ML APPLICATOR 50/CS (WOUND CARE) ×2 IMPLANT
ELECT REM PT RETURN 9FT ADLT (ELECTROSURGICAL) ×3
ELECTRODE REM PT RTRN 9FT ADLT (ELECTROSURGICAL) ×1 IMPLANT
FACESHIELD LNG OPTICON STERILE (SAFETY) IMPLANT
GLOVE BIO SURGEON STRL SZ8.5 (GLOVE) ×6 IMPLANT
GLOVE BIOGEL PI ORTHO PRO SZ8 (GLOVE) ×4
GLOVE ORTHO TXT STRL SZ7.5 (GLOVE) ×3 IMPLANT
GLOVE PI ORTHO PRO STRL SZ8 (GLOVE) ×1 IMPLANT
GLOVE SURG ORTHO 8.0 STRL STRW (GLOVE) ×4 IMPLANT
GOWN STRL NON-REIN LRG LVL3 (GOWN DISPOSABLE) IMPLANT
GOWN STRL REIN 2XL XLG LVL4 (GOWN DISPOSABLE) ×4 IMPLANT
GOWN STRL REIN XL XLG (GOWN DISPOSABLE) ×2 IMPLANT
GUIDEPIN 3.2X17.5 THRD DISP (PIN) ×2 IMPLANT
GUIDEWIRE BEAD TIP (WIRE) ×2 IMPLANT
KIT BASIN OR (CUSTOM PROCEDURE TRAY) ×3 IMPLANT
KIT ROOM TURNOVER OR (KITS) ×3 IMPLANT
MANIFOLD NEPTUNE II (INSTRUMENTS) ×1 IMPLANT
NAIL FEM RETRO 10.5X360 (Nail) ×2 IMPLANT
NS IRRIG 1000ML POUR BTL (IV SOLUTION) ×3 IMPLANT
PACK GENERAL/GYN (CUSTOM PROCEDURE TRAY) ×3 IMPLANT
PAD ABD 8X10 STRL (GAUZE/BANDAGES/DRESSINGS) ×4 IMPLANT
PAD ARMBOARD 7.5X6 YLW CONV (MISCELLANEOUS) ×6 IMPLANT
SCREW CORT TI DBL LEAD 5X30 (Screw) ×2 IMPLANT
SCREW CORT TI DBL LEAD 5X60 (Screw) ×2 IMPLANT
SCREW CORT TI DBL LEAD 5X65 (Screw) ×2 IMPLANT
SCREW CORT TI DBL LEAD 5X70 (Screw) ×2 IMPLANT
SCREW CORT TI DBLE LEAD 5X54 (Screw) ×2 IMPLANT
SPONGE GAUZE 4X4 12PLY (GAUZE/BANDAGES/DRESSINGS) ×6 IMPLANT
SPONGE GAUZE 4X4 12PLY STER LF (GAUZE/BANDAGES/DRESSINGS) ×2 IMPLANT
STAPLER VISISTAT 35W (STAPLE) ×3 IMPLANT
STOCKINETTE IMPERVIOUS LG (DRAPES) ×3 IMPLANT
STRIP CLOSURE SKIN 1/2X4 (GAUZE/BANDAGES/DRESSINGS) ×2 IMPLANT
SUT MNCRL AB 4-0 PS2 18 (SUTURE) ×5 IMPLANT
SUT VIC AB 0 CT1 18XCR BRD 8 (SUTURE) ×1 IMPLANT
SUT VIC AB 0 CT1 8-18 (SUTURE) ×6
SUT VIC AB 2-0 CT1 27 (SUTURE) ×3
SUT VIC AB 2-0 CT1 TAPERPNT 27 (SUTURE) ×1 IMPLANT
SUT VIC AB 3-0 SH 8-18 (SUTURE) ×5 IMPLANT
TOWEL OR 17X24 6PK STRL BLUE (TOWEL DISPOSABLE) ×3 IMPLANT
TOWEL OR 17X26 10 PK STRL BLUE (TOWEL DISPOSABLE) ×3 IMPLANT
TRAY FOLEY CATH 16FRSI W/METER (SET/KITS/TRAYS/PACK) IMPLANT
WATER STERILE IRR 1000ML POUR (IV SOLUTION) ×1 IMPLANT

## 2013-06-27 NOTE — H&P (Signed)
[facility] History and Physical  Patient: Maureen Ford  WUJ:[id number]  DOB: [birth year]  DOS: the patient was seen and examined on 06/27/2013 PCP: Pcp Not In System  Chief Complaint: Fall  HPI: GISSELLE Ford is a 68 y.o. female with Past medical history of hypertension, thyroid disease, COPD, chronic steroid due to arthritis. The patient is coming from home. The patient presented with a fall. She mentions that she was cleaning her kitchen and was trying to walk when she stepped over her [name] and lost balance and hit the ground. She denies any focal neurological deficit after the fall no head injury no neck injury. Pt denies any fever, chills, headache, cough, chest pain, palpitation, shortness of breath, orthopnea, PND, nausea, vomiting, abdominal pain, diarrhea, constipation, active bleeding, burning urination, dizziness, pedal edema,  focal neurological deficit.   Review of Systems: as mentioned in the history of present illness.  A Comprehensive review of the other systems is negative.  Past Medical History  Diagnosis Date  . Hypertension   . Thyroid disease   . COPD (chronic obstructive pulmonary disease)    History reviewed. No pertinent past surgical history. Social History:  reports that she has quit smoking. She does not have any smokeless tobacco history on file. She reports that she does not drink alcohol or use illicit drugs. Independent for most of her  ADL.  Allergies  Allergen Reactions  . Penicillins     *childhood allergy*  . Sulfa Antibiotics Hives    History reviewed. No pertinent family history.  Prior to Admission medications   Medication Sig Start Date End Date Taking? Authorizing Provider  levothyroxine (SYNTHROID, LEVOTHROID) 25 MCG tablet Take 25 mcg by mouth daily before breakfast.   Yes Historical Provider, MD  nebivolol (BYSTOLIC) 5 MG tablet Take 2.5 mg by mouth daily.    Yes Historical Provider, MD  predniSONE (DELTASONE) 5  MG tablet Take 5 mg by mouth daily with breakfast.   Yes Historical Provider, MD  sertraline (ZOLOFT) 50 MG tablet Take 50 mg by mouth daily.   Yes Historical Provider, MD  tiotropium (SPIRIVA) 18 MCG inhalation capsule Place 18 mcg into inhaler and inhale daily.   Yes Historical Provider, MD    Physical Exam: Filed Vitals:   06/27/13 0130 06/27/13 0145 06/27/13 0200 06/27/13 0215  BP: 133/73 134/64 129/65 135/68  Pulse: 57 59 60 61  Temp:      TempSrc:      Resp: 18 19 18 15   Height:      Weight:      SpO2: 95% 96% 96% 97%    General: Alert, Awake and Oriented to Time, Place and Person. Appear in moderate distress Eyes: PERRL ENT: Oral Mucosa clear moist. Neck: No JVD Cardiovascular: S1 and S2 Present, no Murmur, Peripheral Pulses Present Respiratory: Bilateral Air entry equal and Decreased, No Crackles, expiratory wheezes Abdomen: Bowel Sound Present, Soft and Non tender Skin: No Rash Extremities: Bilateral Pedal edema, no calf tenderness Neurologic: Grossly Unremarkable.  Labs on Admission:  CBC:  Recent Labs Lab 06/26/13 2346  WBC 9.7  NEUTROABS 8.1*  HGB 13.2  HCT 38.6  MCV 102.9*  PLT 183    CMP     Component Value Date/Time   NA 139 06/26/2013 2346   K 4.3 06/26/2013 2346   CL 101 06/26/2013 2346   CO2 25 06/26/2013 2346   GLUCOSE 126* 06/26/2013 2346   BUN 21 06/26/2013 2346   CREATININE 0.99 06/26/2013 2346  CALCIUM 9.5 06/26/2013 2346   GFRNONAA 58* 06/26/2013 2346   GFRAA 67* 06/26/2013 2346    No results found for this basename: LIPASE, AMYLASE,  in the last 168 hours No results found for this basename: AMMONIA,  in the last 168 hours  No results found for this basename: CKTOTAL, CKMB, CKMBINDEX, TROPONINI,  in the last 168 hours BNP (last 3 results) No results found for this basename: PROBNP,  in the last 8760 hours  Radiological Exams on Admission: Dg Chest Portable 1 View  06/27/2013   CLINICAL DATA:  Fall with femur fracture.  EXAM: PORTABLE CHEST - 1  VIEW  COMPARISON:  05/04/2005  FINDINGS: Normal heart size and pulmonary vascularity. There appears to be infiltration or atelectasis in the left lung base obscuring the left hemidiaphragm. Pneumonia may be present. Pleural thickening in the left apex. 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  IMPRESSION: Focal infiltration in the left lung base may represent pneumonia.   Electronically Signed   By: Burman Nieves M.D.   On: 06/27/2013 00:46   Dg Hip Portable 1 View Left  06/27/2013   CLINICAL DATA:  Fall.  Distal femur fracture.  EXAM: PORTABLE LEFT HIP - 1 VIEW  COMPARISON:  None.  FINDINGS: No evidence of acute fracture or dislocation of the left hip on single view.  IMPRESSION: Negative.   Electronically Signed   By: Burman Nieves M.D.   On: 06/27/2013 00:47   Dg Knee Complete 4 Views Left  06/26/2013   CLINICAL DATA:  History of trauma from a fall.  Leg pain.  EXAM: LEFT KNEE - COMPLETE 4+ VIEW  COMPARISON:  No priors.  FINDINGS: Multiple views of the left knee demonstrate a highly comminuted fracture of the distal femoral metadiaphysis. There is approximately 2 cm of lateral displacement and approximately 15 degrees of dorsal angulation. The knee joint itself appears intact. Overlying soft tissues appear swollen.  IMPRESSION: 1. Severely comminuted displaced and mildly angulated fracture of the distal femoral metadiaphysis, as above.   Electronically Signed   By: Trudie Reed M.D.   On: 06/26/2013 23:51    EKG: Independently reviewed. normal sinus rhythm, nonspecific ST and T waves changes.  Assessment/Plan Principal Problem:   Femur fracture Active Problems:   Chronic steroid use   Hypertension   Hypothyroidism   Mood disorder   COPD (chronic obstructive pulmonary disease)   1. Femur fracture The patient is presenting with a fracture of her a mechanical fall. Orthopedics has been consulted who has recommended that they've been attempt to repair the fractured  femur sometime today. Patient will be kept n.p.o.. IV Dilaudid and IV Zofran for symptom relief. The leg is placed in traction at present.  2. Chronic steroid use Patient has been on 5 mg prednisone daily for last many months or years for arthritis. Preoperatively I will give her 50 mg every 8 hours Solu-Cortef.  3. Questionable infiltrate in the lung x-ray The patient denies any complaint of cough prior to this event and that she is aware and nausea he has leukocytosis nor she is hypoxic Therefore I would not treat her with antibiotics and continue to monitor I would add albuterol 2 inhaler regimen for her COPD history  4. Pedal edema The patient does not appear volume overloaded but does mention that she has leg swelling since Thanksgiving I will get an echocardiogram for further workup as well as duplex of the leg  Consults: Orthopedics  DVT Prophylaxis: subcutaneous Heparin  Nutrition: N.p.o.  Code Status: Full  Disposition: Admitted to inpatient in MedSurg unit.  Author: Lynden OxfordPranav Salome Hautala, MD [facility] Pager: [phone] 06/27/2013, 3:44 AM    If 7PM-7AM, please contact night-coverage [email or web address] Password [email or web address]

## 2013-06-27 NOTE — Progress Notes (Signed)
Attending MD notified regarding DVT found in right lower extremity. Instructed to call ortho about starting vte treatment; Montez MoritaKeith Paul, PA recommended therapeutic Lovenox which was relayed back to attending MD and ordered.

## 2013-06-27 NOTE — Progress Notes (Signed)
Orthopedic Tech Progress Note Patient Details:  Maureen Ford 08-20-1945 [id number]  Patient ID: Maureen Ford, female   DOB: 08-20-1945, 68 y.o.   MRN: [id number]   Maureen Ford, Maureen Ford 06/27/2013, 2:27 PMTrapeze bar

## 2013-06-27 NOTE — Anesthesia Preprocedure Evaluation (Addendum)
Anesthesia Evaluation  Patient identified by MRN, date of birth, ID band Patient awake    Reviewed: Allergy & Precautions, H&P , NPO status , Patient's Chart, lab work & pertinent test results  Airway Mallampati: III TM Distance: >3 FB Neck ROM: Full  Mouth opening: Limited Mouth Opening  Dental  (+) Dental Advisory Given, Teeth Intact and Poor Dentition   Pulmonary COPD COPD inhaler, former smoker,          Cardiovascular hypertension, Pt. on home beta blockers     Neuro/Psych PSYCHIATRIC DISORDERS    GI/Hepatic   Endo/Other  Hypothyroidism   Renal/GU      Musculoskeletal   Abdominal   Peds  Hematology   Anesthesia Other Findings   Reproductive/Obstetrics                         Anesthesia Physical Anesthesia Plan  ASA: III  Anesthesia Plan: General   Post-op Pain Management:    Induction: Intravenous  Airway Management Planned: Oral ETT  Additional Equipment:   Intra-op Plan:   Post-operative Plan: Extubation in OR  Informed Consent: I have reviewed the patients History and Physical, chart, labs and discussed the procedure including the risks, benefits and alternatives for the proposed anesthesia with the patient or authorized representative who has indicated his/her understanding and acceptance.   Dental advisory given  Plan Discussed with: Anesthesiologist and Surgeon  Anesthesia Plan Comments:         Anesthesia Quick Evaluation

## 2013-06-27 NOTE — Consult Note (Signed)
ORTHOPAEDIC CONSULTATION  REQUESTING PHYSICIAN: Sharyon Cable, MD  Chief Complaint: Left [place] femur fracture  HPI: Maureen Ford is a 68 y.o. female who complains of  Mechanical fall  Past Medical History  Diagnosis Date  . Hypertension   . Thyroid disease   . COPD (chronic obstructive pulmonary disease)    History reviewed. No pertinent past surgical history. History   Social History  . Marital Status: Divorced    Spouse Name: N/A    Number of Children: N/A  . Years of Education: N/A   Social History Main Topics  . Smoking status: Former Research scientist (life sciences)  . Smokeless tobacco: None  . Alcohol Use: No  . Drug Use: No  . Sexual Activity: None   Other Topics Concern  . None   Social History Narrative  . None   History reviewed. No pertinent family history. Allergies  Allergen Reactions  . Penicillins     *childhood allergy*  . Sulfa Antibiotics Hives   Prior to Admission medications   Medication Sig Start Date End Date Taking? Authorizing Provider  levothyroxine (SYNTHROID, LEVOTHROID) 25 MCG tablet Take 25 mcg by mouth daily before breakfast.   Yes Historical Provider, MD  nebivolol (BYSTOLIC) 5 MG tablet Take 2.5 mg by mouth daily.    Yes Historical Provider, MD  predniSONE (DELTASONE) 5 MG tablet Take 5 mg by mouth daily with breakfast.   Yes Historical Provider, MD  sertraline (ZOLOFT) 50 MG tablet Take 50 mg by mouth daily.   Yes Historical Provider, MD  tiotropium (SPIRIVA) 18 MCG inhalation capsule Place 18 mcg into inhaler and inhale daily.   Yes Historical Provider, MD   Dg Chest Portable 1 View  06/27/2013   CLINICAL DATA:  Fall with femur fracture.  EXAM: PORTABLE CHEST - 1 VIEW  COMPARISON:  05/04/2005  FINDINGS: Normal heart size and pulmonary vascularity. There appears to be infiltration or atelectasis in the left lung base obscuring the left hemidiaphragm. Pneumonia may be present. Pleural thickening in the left apex. Diffuse fibrosis and  emphysematous changes in the lungs.  IMPRESSION: Focal infiltration in the left lung base may represent pneumonia.   Electronically Signed   By: Lucienne Capers M.D.   On: 06/27/2013 00:46   Dg Hip Portable 1 View Left  06/27/2013   CLINICAL DATA:  Fall.  Distal femur fracture.  EXAM: PORTABLE LEFT HIP - 1 VIEW  COMPARISON:  None.  FINDINGS: No evidence of acute fracture or dislocation of the left hip on single view.  IMPRESSION: Negative.   Electronically Signed   By: Lucienne Capers M.D.   On: 06/27/2013 00:47   Dg Knee Complete 4 Views Left  06/26/2013   CLINICAL DATA:  History of trauma from a fall.  Leg pain.  EXAM: LEFT KNEE - COMPLETE 4+ VIEW  COMPARISON:  No priors.  FINDINGS: Multiple views of the left knee demonstrate a highly comminuted fracture of the distal femoral metadiaphysis. There is approximately 2 cm of lateral displacement and approximately 15 degrees of dorsal angulation. The knee joint itself appears intact. Overlying soft tissues appear swollen.  IMPRESSION: 1. Severely comminuted displaced and mildly angulated fracture of the distal femoral metadiaphysis, as above.   Electronically Signed   By: Vinnie Langton M.D.   On: 06/26/2013 23:51    Positive ROS: All other systems have been reviewed and were otherwise negative with the exception of those mentioned in the HPI and as above.  Labs cbc  Recent  Labs  06/26/13 2346  WBC 9.7  HGB 13.2  HCT 38.6  PLT 183    Labs inflam No results found for this basename: ESR, CRP,  in the last 72 hours  Labs coag  Recent Labs  06/26/13 2346  INR 1.03     Recent Labs  06/26/13 2346  NA 139  K 4.3  CL 101  CO2 25  GLUCOSE 126*  BUN 21  CREATININE 0.99  CALCIUM 9.5    Physical Exam: Filed Vitals:   06/26/13 2306  BP: 150/89  Pulse: 65  Temp: 97.7 F (36.5 C)  Resp: 14   General: Alert, no acute distress Cardiovascular: No pedal edema Respiratory: No cyanosis, no use of accessory musculature GI: No  organomegaly, abdomen is soft and non-tender Skin: No lesions in the area of chief complaint Neurologic: Sensation intact distally Psychiatric: Patient is competent for consent with normal mood and affect Lymphatic: No axillary or cervical lymphadenopathy  MUSCULOSKELETAL:  Pain at knee and [place] region. Compartments soft, DIstally NVI. No skin lesions Other extremities are atraumatic with painless ROM and NVI.  Assessment: Left [place] femoral fracture  Plan: OR on 1/2 for [place] IM nail pending clearance from Hospitalist team Weight Bearing Status: Bedrest for now, will adjust post op Place foley for pre-op comfort to be removed POD 1 PT VTE px: SCD's and Hold chemical prophylaxis for pending OR in the Edgardo Roys, D, MD Cell [phone]   06/27/2013 1:07 AM

## 2013-06-27 NOTE — Preoperative (Signed)
Beta Blockers   Reason not to administer Beta Blockers:Not Applicable 

## 2013-06-27 NOTE — Progress Notes (Signed)
[facility] Progress Note [facility] TEAM [address]    Adelfa KohMarianne P Nussbaumer ZHY:[id number] DOB: 12-02-1945 DOA: 06/26/2013 PCP: Josue HectorNYLAND,LEONARD ROBERT, MD  Brief narrative: [age] year old female patient with medical history of hypertension, COPD and chronic steroids due to arthritis. Experienced a mechanical fall at home. Upon evaluation in the emergency department x-ray revealed a severely comminuted displaced and now lately angulated fracture of the distal femoral metadiaphysis. She was evaluated by orthopedic service and plans were to take to the OR on 06/27/2013.  Assessment/Plan: Active Problems:   Fracture, femur, distal -post intramedullary femoral nailing this morning -Management per orthopedic    COPD (chronic obstructive pulmonary disease) -Respiratory status preoperatively and was on room air, is currently on 2 L nasal cannula postoperative -No wheezing preoperatively    Hypertension    Hypothyroidism    Chronic steroid use -Need to continue postoperatively to prevent acute adrenal insufficiency -Currently on Solu-Cortef stress dose steroids    Mood disorder   DVT prophylaxis: Her discretion of orthopedic surgical team-currently on Lovenox Code Status: Full Family Communication: Son at bedside Disposition Plan/Expected LOS: Remain inpatient until his recovered adequately from surgical procedure-disposition pending postoperative physical therapy evaluation Isolation: None Nutritional Status: Stable  Consultants: Orthopedics  Procedures: Left intramedullary retrograde femoral nailing  06/27/12  2-D echocardiogram pending  Lower extremity venous duplex pending  Antibiotics: Clindamycin x1 preop  HPI/Subjective: Patient alert preoperatively without any specific complaints  Objective: Blood pressure 115/53, pulse 83, temperature 98 F (36.7 C), temperature source Oral, resp. rate 18, height 5\' 5"  (1.651 m), weight 140 lb (63.504 kg), SpO2  99.00%.  Intake/Output Summary (Last 24 hours) at 06/27/13 1432 Last data filed at 06/27/13 1300  Gross per 24 hour  Intake   1750 ml  Output    100 ml  Net   1650 ml     Exam: ALT exam completed. Patient admitted at 5:39 AM today  Scheduled Meds:  Scheduled Meds: . albuterol  1-2 puff Inhalation QID  . clindamycin (CLEOCIN) IV  600 mg Intravenous Q6H  . docusate sodium  100 mg Oral BID  . [START ON 06/28/2013] enoxaparin (LOVENOX) injection  40 mg Subcutaneous Q24H  . hydrocortisone sod succinate (SOLU-CORTEF) inj  50 mg Intravenous Q8H  . [START ON 06/28/2013] levothyroxine  25 mcg Oral QAC breakfast  . [START ON 06/28/2013] nebivolol  2.5 mg Oral Daily  . ondansetron (ZOFRAN) IV  4 mg Intravenous Once  . pantoprazole (PROTONIX) IV  40 mg Intravenous Q24H  . [START ON 06/28/2013] pneumococcal 23 valent vaccine  0.5 mL Intramuscular Tomorrow-1000  . senna  1 tablet Oral BID  . [START ON 06/28/2013] sertraline  50 mg Oral Daily  . tiotropium  18 mcg Inhalation Daily   Continuous Infusions: . 0.45 % NaCl with KCl 20 mEq / L      **Reviewed in detail by the Attending Physician  Data Reviewed: Basic Metabolic Panel:  Recent Labs Lab 06/26/13 2346  NA 139  K 4.3  CL 101  CO2 25  GLUCOSE 126*  BUN 21  CREATININE 0.99  CALCIUM 9.5   Liver Function Tests: No results found for this basename: AST, ALT, ALKPHOS, BILITOT, PROT, ALBUMIN,  in the last 168 hours No results found for this basename: LIPASE, AMYLASE,  in the last 168 hours No results found for this basename: AMMONIA,  in the last 168 hours CBC:  Recent Labs Lab 06/26/13 2346  WBC 9.7  NEUTROABS 8.1*  HGB 13.2  HCT 38.6  MCV  102.9*  PLT 183   Cardiac Enzymes: No results found for this basename: CKTOTAL, CKMB, CKMBINDEX, TROPONINI,  in the last 168 hours BNP (last 3 results) No results found for this basename: PROBNP,  in the last 8760 hours CBG: No results found for this basename: GLUCAP,  in the last 168  hours  Recent Results (from the past 240 hour(s))  SURGICAL PCR SCREEN     Status: None   Collection Time    06/27/13  6:34 AM      Result Value Range Status   MRSA, PCR NEGATIVE  NEGATIVE Final   Staphylococcus aureus NEGATIVE  NEGATIVE Final   Comment:            The Xpert SA Assay (FDA     approved for NASAL specimens     in patients over 68 years of age),     is one component of     a comprehensive surveillance     program.  Test performance has     been validated by [employer] for patients greater     than or equal to 63 year old.     It is not intended     to diagnose infection nor to     guide or monitor treatment.     Studies:  Recent x-ray studies have been reviewed in detail by the Attending Physician     Junious Silk, ANP [facility] Office  [phone] Pager [phone]  **If unable to reach the above provider after paging please contact the Flow Manager @ [phone]  On-Call/Text Page:      [email or web address]      password [email or web address]  If 7PM-7AM, please contact night-coverage [email or web address] Password [email or web address] 06/27/2013, 2:32 PM   LOS: 1 day

## 2013-06-27 NOTE — Transfer of Care (Signed)
Immediate Anesthesia Transfer of Care Note  Patient: Maureen Ford  Procedure(s) Performed: Procedure(s): INTRAMEDULLARY (IM) RETROGRADE FEMORAL NAILING (Left)  Patient Location: PACU  Anesthesia Type:General  Level of Consciousness: awake and patient cooperative  Airway & Oxygen Therapy: Patient Spontanous Breathing and Patient connected to nasal cannula oxygen  Post-op Assessment: Report given to PACU RN, Post -op Vital signs reviewed and stable and Patient moving all extremities X 4  Post vital signs: Reviewed and stable  Complications: No apparent anesthesia complications

## 2013-06-27 NOTE — Op Note (Signed)
06/26/2013 - 06/27/2013  12:10 PM  PATIENT:  Maureen Ford    PRE-OPERATIVE DIAGNOSIS:  Left supracondylar distal femur fracture  POST-OPERATIVE DIAGNOSIS:  Same  PROCEDURE:  INTRAMEDULLARY (IM) RETROGRADE FEMORAL NAILING  SURGEON:  Eulas PostLANDAU,Rashia Mckesson P, MD  PHYSICIAN ASSISTANT: Janace LittenBrandon Parry, OPA-C, present and scrubbed throughout the case, critical for completion in a timely fashion, and for retraction, instrumentation, and closure.  ANESTHESIA:   General  PREOPERATIVE INDICATIONS:  [name] is a  68 y.o. female with a diagnosis of Left supra condylar fracture who failed conservative measures and elected for surgical management.    The risks benefits and alternatives were discussed with the patient preoperatively including but not limited to the risks of infection, bleeding, nerve injury, cardiopulmonary complications, the need for revision surgery, among others, and the patient was willing to proceed.  OPERATIVE IMPLANTS: [employer] [place] retrograde femoral nail size 10 mm x 36 centimeters with a total distal interlocking bolts x4 and one proximal interlocking bolt.  OPERATIVE FINDINGS: Osteopenia and comminuted distal supracondylar femur fracture that was extra-articular  OPERATIVE PROCEDURE: The patient was brought to the operating room and placed in the supine position. General anesthesia was administered. IV antibiotics were given. The left lower extremity was prepped and draped in usual sterile fashion. Time out was performed. Incision was made over the midline of the anterior knee, dissection carried down to the joint, and medial parapatellar approach was performed. The patella was subluxated laterally, and I introduced a guidepin in the appropriate center position on AP and lateral views. I reamed over the guidewire and opened the distal femur.  I reduced the fracture anatomically and placed a guidewire down the shaft of the femur, measured my length, and sequentially  reamed up to a 12. I then placed a 10 mm nail. It actually had a fair amount of interference fit at the isthmus. I confirmed length and rotational alignment clinically, as well as radiographically, and then secured the nail distally with 4 interlocking bolts. I secured the locking sleeve making this a fixed angle construct. I then completed the fixation proximally with a perfect circle technique. Final C-arm pictures were taken. The wounds were irrigated copiously and the parapatellar tissue closed with Vicryl followed by Vicryl for the subcutaneous tissue with Monocryl and Steri-Strips and sterile gauze and a knee immobilizer. The distal tip of the nail was not within the joint, and was recessed at least 5 mm visualized by direct visualization and radiographic confirmation.  She tolerated the procedure well with no complications. She will be touch toe weightbearing on the left lower extremity for a period of 6 weeks.

## 2013-06-27 NOTE — Progress Notes (Signed)
ANTICOAGULATION CONSULT NOTE - Initial Consult  Pharmacy Consult for Lovenox Indication: DVT, right lower extremity  Allergies  Allergen Reactions  . Penicillins     *childhood allergy*  . Sulfa Antibiotics Hives    Patient Measurements: Height: 5\' 5"  (165.1 cm) Weight: 140 lb (63.504 kg) IBW/kg (Calculated) : 57  Vital Signs: Temp: 98 F (36.7 C) (01/02 1330) BP: 115/53 mmHg (01/02 1322) Pulse Rate: 83 (01/02 1322)  Labs:  Recent Labs  06/26/13 2346  HGB 13.2  HCT 38.6  PLT 183  APTT 23*  LABPROT 13.3  INR 1.03  CREATININE 0.99    Estimated Creatinine Clearance: 49.6 ml/min (by C-G formula based on Cr of 0.99).   Medical History: Past Medical History  Diagnosis Date  . Hypertension   . Thyroid disease   . COPD (chronic obstructive pulmonary disease)   . Hypothyroidism   . Anxiety   . Shortness of breath   . GERD (gastroesophageal reflux disease)   . Headache(784.0)   . Arthritis     RA  . Fracture closed, femur, shaft 06/27/2013    LEFT LEG  . Fracture, femur, distal 06/27/2013   Assessment:   New right leg DVT.  S/p left IM nail earlier today.  Discussed briefly with Dr. Donna BernardViyouh; full-dose Lovenox okayed by Orthopedics.   Weight listed as 140 lbs, but patient believes she's ~130 lbs. (59-63.5 kg).  Goal of Therapy:  Anti-Xa level 0.6-1 units/ml 4hrs after LMWH dose given Monitor platelets by anticoagulation protocol: Yes   Plan:   Lovenox 60 mg SQ q12hrs.  CBC at least q72hrs while on Lovenox.  Will follow-up for transition to oral anticoagulant.  Dennie FettersEgan, [place] Mackie Donovan, [place] Pager: [phone] 06/27/2013,6:45 PM

## 2013-06-27 NOTE — Anesthesia Procedure Notes (Signed)
Procedure Name: Intubation Date/Time: 06/27/2013 10:46 AM Performed by: Carola Frost Pre-anesthesia Checklist: Patient identified, Timeout performed, Emergency Drugs available, Suction available and Patient being monitored Patient Re-evaluated:Patient Re-evaluated prior to inductionOxygen Delivery Method: Circle system utilized Preoxygenation: Pre-oxygenation with 100% oxygen Intubation Type: IV induction Ventilation: Mask ventilation without difficulty Laryngoscope Size: Mac and 3 Grade View: Grade II Tube type: Oral Tube size: 7.0 mm Number of attempts: 1 Airway Equipment and Method: Stylet and LTA kit utilized Placement Confirmation: positive ETCO2,  ETT inserted through vocal cords under direct vision and breath sounds checked- equal and bilateral Secured at: 21 cm Tube secured with: Tape Dental Injury: Teeth and Oropharynx as per pre-operative assessment

## 2013-06-27 NOTE — ED Provider Notes (Signed)
Medical screening examination/treatment/procedure(s) were conducted as a shared visit with non-physician practitioner(s) and myself.  I personally evaluated the patient during the encounter.  EKG Interpretation    Date/Time:  Friday June 27 2013 00:55:24 EST Ventricular Rate:  59 PR Interval:  156 QRS Duration: 77 QT Interval:  450 QTC Calculation: 446 R Axis:   24 Text Interpretation:  Sinus rhythm Nonspecific T abnrm, anterolateral leads Confirmed by Bebe ShaggyWICKLINE  MD, Keiji Melland [phone]([id number]) on 06/27/2013 1:37:41 AM             EKG Interpretation    Date/Time:  Friday June 27 2013 00:55:24 EST Ventricular Rate:  59 PR Interval:  156 QRS Duration: 77 QT Interval:  450 QTC Calculation: 446 R Axis:   24 Text Interpretation:  Sinus rhythm Nonspecific T abnrm, anterolateral leads Confirmed by Bebe ShaggyWICKLINE  MD, Corona Popovich [phone]) on 06/27/2013 1:37:41 AM             Joya Gaskinsonald W Tempie Gibeault, MD 06/27/13 [id number]

## 2013-06-27 NOTE — Anesthesia Postprocedure Evaluation (Signed)
  Anesthesia Post-op Note  Patient: Maureen SayresMarianne P Mckelvie  Procedure(s) Performed: Procedure(s): INTRAMEDULLARY (IM) RETROGRADE FEMORAL NAILING (Left)  Patient Location: PACU  Anesthesia Type:General  Level of Consciousness: awake, oriented, sedated and patient cooperative  Airway and Oxygen Therapy: Patient Spontanous Breathing  Post-op Pain: mild  Post-op Assessment: Post-op Vital signs reviewed, Patient's Cardiovascular Status Stable, Respiratory Function Stable, Patent Airway, No signs of Nausea or vomiting and Pain level controlled  Post-op Vital Signs: stable  Complications: No apparent anesthesia complications

## 2013-06-27 NOTE — Progress Notes (Signed)
  Echocardiogram 2D Echocardiogram has been performed.  Cathie BeamsGREGORY, Uri Covey 06/27/2013, 3:23 PM

## 2013-06-27 NOTE — ED Notes (Signed)
MD at Bedside.

## 2013-06-27 NOTE — Progress Notes (Signed)
The patient has been re-examined, and the chart reviewed, and there have been no interval changes to the documented history and physical.    The risks benefits and alternatives were discussed with the patient including but not limited to the risks of nonoperative treatment, versus surgical intervention including infection, bleeding, nerve injury, malunion, nonunion, the need for revision surgery, hardware prominence, hardware failure, the need for hardware removal, blood clots, cardiopulmonary complications, morbidity, mortality, among others, and they were willing to proceed.    Chaley Castellanos P, MD  

## 2013-06-27 NOTE — ED Provider Notes (Signed)
Patient seen/examined in the Emergency Department in conjunction with Midlevel Provider [place] Patient reports pain in left knee s/p fall Exam : awake/alert, but has tenderness to left knee Plan: she has distal femur fracture, will need admission    Maureen Gaskinsonald W Naphtali Zywicki, MD 06/27/13 0008

## 2013-06-27 NOTE — Progress Notes (Signed)
VASCULAR LAB PRELIMINARY  PRELIMINARY  PRELIMINARY  PRELIMINARY  Right lower extremity venous duplex completed.    Preliminary report:  Positive for DVT coursing from the popliteal through the femoral and common femoral veins. There is a superficial thrombus of the saphenofemoral junction with no thrombus noted in the greater saphenous vein. There is no obvious propagation to the left at this time. No evidence of a right Baker's cyst.  Maureen Ford, RVS 06/27/2013, 4:43 PM

## 2013-06-27 NOTE — ED Notes (Signed)
Admitting MD at bedside.

## 2013-06-28 LAB — BASIC METABOLIC PANEL
BUN: 17 mg/dL (ref 6–23)
CO2: 22 mEq/L (ref 19–32)
CREATININE: 1.05 mg/dL (ref 0.50–1.10)
Calcium: 8.4 mg/dL (ref 8.4–10.5)
Chloride: 102 mEq/L (ref 96–112)
GFR calc non Af Amer: 54 mL/min — ABNORMAL LOW (ref >90)
GFR, EST AFRICAN AMERICAN: 62 mL/min — AB (ref >90)
GLUCOSE: 111 mg/dL — AB (ref 70–99)
POTASSIUM: 4.5 meq/L (ref 3.7–5.3)
Sodium: 138 mEq/L (ref 137–147)

## 2013-06-28 LAB — URINALYSIS, ROUTINE W REFLEX MICROSCOPIC
BILIRUBIN URINE: NEGATIVE
Glucose, UA: NEGATIVE mg/dL
HGB URINE DIPSTICK: NEGATIVE
KETONES UR: NEGATIVE mg/dL
NITRITE: POSITIVE — AB
Protein, ur: NEGATIVE mg/dL
SPECIFIC GRAVITY, URINE: 1.008 (ref 1.005–1.030)
Urobilinogen, UA: 0.2 mg/dL (ref 0.0–1.0)
pH: 6.5 (ref 5.0–8.0)

## 2013-06-28 LAB — URINE MICROSCOPIC-ADD ON

## 2013-06-28 LAB — CBC
HCT: 30.9 % — ABNORMAL LOW (ref 36.0–46.0)
Hemoglobin: 10.3 g/dL — ABNORMAL LOW (ref 12.0–15.0)
MCH: 34.9 pg — ABNORMAL HIGH (ref 26.0–34.0)
MCHC: 33.3 g/dL (ref 30.0–36.0)
MCV: 104.7 fL — ABNORMAL HIGH (ref 78.0–100.0)
PLATELETS: 162 10*3/uL (ref 150–400)
RBC: 2.95 MIL/uL — AB (ref 3.87–5.11)
RDW: 13.7 % (ref 11.5–15.5)
WBC: 10.9 10*3/uL — ABNORMAL HIGH (ref 4.0–10.5)

## 2013-06-28 MED ORDER — WARFARIN SODIUM 5 MG PO TABS
5.0000 mg | ORAL_TABLET | Freq: Once | ORAL | Status: AC
  Administered 2013-06-28: 5 mg via ORAL
  Filled 2013-06-28: qty 1

## 2013-06-28 MED ORDER — WARFARIN - PHARMACIST DOSING INPATIENT: Freq: Every day | Status: DC

## 2013-06-28 MED ORDER — ALBUTEROL SULFATE HFA 108 (90 BASE) MCG/ACT IN AERS
1.0000 | INHALATION_SPRAY | Freq: Three times a day (TID) | RESPIRATORY_TRACT | Status: DC
  Administered 2013-06-28: 2 via RESPIRATORY_TRACT
  Administered 2013-06-28: 1 via RESPIRATORY_TRACT
  Administered 2013-06-29 (×3): 2 via RESPIRATORY_TRACT

## 2013-06-28 MED ORDER — WARFARIN VIDEO: Freq: Once | Status: DC

## 2013-06-28 MED ORDER — COUMADIN BOOK
Freq: Once | Status: DC
  Filled 2013-06-28: qty 1

## 2013-06-28 NOTE — Progress Notes (Signed)
ANTICOAGULATION CONSULT NOTE - Follow Up Consult  Pharmacy Consult for Lovenox and Coumadin Indication: DVT  Allergies  Allergen Reactions  . Penicillins     *childhood allergy*  . Sulfa Antibiotics Hives    Patient Measurements: Height: 5\' 5"  (165.1 cm) Weight: 140 lb (63.504 kg) IBW/kg (Calculated) : 57 Heparin Dosing Weight:   Vital Signs: Temp: 98.6 F (37 C) (01/03 0449) Temp src: Oral (01/03 0449) BP: 122/65 mmHg (01/03 0449) Pulse Rate: 82 (01/03 0449)  Labs:  Recent Labs  06/26/13 2346 06/27/13 1900 06/28/13 0525  HGB 13.2 11.1* 10.3*  HCT 38.6 33.4* 30.9*  PLT 183 164 162  APTT 23*  --   --   LABPROT 13.3  --   --   INR 1.03  --   --   CREATININE 0.99 1.06 1.05    Estimated Creatinine Clearance: 46.8 ml/min (by C-G formula based on Cr of 1.05).   Medications:  Lovenox 60mg  q12h  Assessment: 67yof on Lovenox now bridging to Coumadin (Day 1 of minimum 5 Day overlap) for acute RLE DVT.  - Baseline INR: 1.03 - H/H trending down, Plts stable - No significant bleeding reported - Warfarin points: 2 - Weight: 63kg, CrCl 47 ml/min  Goal of Therapy:  INR 2-3 Anti-Xa level 0.6-1 units/ml 4hrs after LMWH dose given Monitor platelets by anticoagulation protocol: Yes   Plan:  1. Continue Lovenox 60mg  SQ q12h 2. Coumadin 5mg  po x 1 today 3. Daily INR, CBC q72h 4. Coumadin educational book/video  Cleon DewDulaney, [place] Robert [phone] 06/28/2013,11:18 AM

## 2013-06-28 NOTE — Progress Notes (Signed)
[facility] Progress Note  Subjective  Doing ok Pain tolerable in both legs Again had noted swelling in R leg since thanksgiving but didn't think anything of it Has not been up with PT yet  Pt reported some difficulty with swallowing breakfast this am, first time this has ever happened. Does report some reflux Description sounds like globus sensation but resolved after a few minutes   No previous hx of DVT/PE  Review of Systems  Constitutional: Negative for fever and chills.  Eyes: Negative for blurred vision.  Respiratory: Negative for hemoptysis, shortness of breath and wheezing.   Cardiovascular: Negative for chest pain and palpitations.  Gastrointestinal: Positive for heartburn. Negative for nausea, vomiting and abdominal pain.  Neurological: Negative for tingling, sensory change and headaches.     Objective   BP 122/65  Pulse 82  Temp(Src) 98.6 F (37 C) (Oral)  Resp 18  Ht 5\' 5"  (1.651 m)  Wt 63.504 kg (140 lb)  BMI 23.30 kg/m2  SpO2 95%  Intake/Output     01/02 0701 - 01/03 0700 01/03 0701 - 01/04 0700   P.O. 560 120   I.V. (mL/kg) 2676.3 (42.1)    IV Piggyback 50    Total Intake(mL/kg) 3286.3 (51.7) 120 (1.9)   Urine (mL/kg/hr) 225 (0.1)    Blood 100 (0.1)    Total Output 325     Net +2961.3 +120        Urine Occurrence 5 x 1 x     Labs  Results for CARNETTA, LOSADA (MRN [id number]) as of 06/28/2013 11:15  Ref. Range 06/28/2013 05:25  Sodium Latest Range: 137-147 mEq/L 138  Potassium Latest Range: 3.7-5.3 mEq/L 4.5  Chloride Latest Range: 96-112 mEq/L 102  CO2 Latest Range: 19-32 mEq/L 22  BUN Latest Range: 6-23 mg/dL 17  Creatinine Latest Range: 0.50-1.10 mg/dL 1.32  Calcium Latest Range: 8.4-10.5 mg/dL 8.4  GFR calc non Af Amer Latest Range: >90 mL/min 54 (L)  GFR calc Af Amer Latest Range: >90 mL/min 62 (L)  Glucose Latest Range: 70-99 mg/dL 440 (H)  WBC Latest Range: 4.0-10.5 K/uL 10.9 (H)  RBC Latest Range: 3.87-5.11  MIL/uL 2.95 (L)  Hemoglobin Latest Range: 12.0-15.0 g/dL 10.2 (L)  HCT Latest Range: 36.0-46.0 % 30.9 (L)  MCV Latest Range: 78.0-100.0 fL 104.7 (H)  MCH Latest Range: 26.0-34.0 pg 34.9 (H)  MCHC Latest Range: 30.0-36.0 g/dL 72.5  RDW Latest Range: 11.5-15.5 % 13.7  Platelets Latest Range: 150-400 K/uL 162    Urinalysis    Component Value Date/Time   COLORURINE YELLOW 06/28/2013 0550   APPEARANCEUR CLOUDY* 06/28/2013 0550   LABSPEC 1.008 06/28/2013 0550   PHURINE 6.5 06/28/2013 0550   GLUCOSEU NEGATIVE 06/28/2013 0550   HGBUR NEGATIVE 06/28/2013 0550   BILIRUBINUR NEGATIVE 06/28/2013 0550   KETONESUR NEGATIVE 06/28/2013 0550   PROTEINUR NEGATIVE 06/28/2013 0550   UROBILINOGEN 0.2 06/28/2013 0550   NITRITE POSITIVE* 06/28/2013 0550   LEUKOCYTESUR MODERATE* 06/28/2013 0550        Exam  Gen: awake and alert, on bed pan  Lungs: clear anterior fields Cardiac: s1 and s2, reg Abd: soft, NTND, +BS Ext:       Right lower extremity   + swelling   No DCT  Distal motor and sensory functions intact  Ext warm  + DP pulse    Will have knee high ted placed       Left Lower Extremity  Dressing c/d/i  Distal motor and sensory functions intact  Knee immobilizer  in place  Ice to L thigh  Ext warm  + DP pulse     Assessment and Plan   POD/HD#: 1  68 y/o female s/p fall with L distal femur fx and R leg DVT on admission   1. Fall  2. L distal femur fx s/p IMN  NWB  Dressing change as needed  PT/OT evals  Ice and elevate  Toe and ankle ROM as tolerated  Knee immobilizer on when working with therapy   3. R leg DVT  Started on therapeutic lovenox yesterday after called about + doppler  Will need to start coumadin today for treatment post dc  Will need to be covered with therapeutic lovenox for at least 5 days and for INR >2 for 24 hrs  PCP is dr. Lysbeth GalasNyland in [place]   Recommend 3-6 months of tx as this is first DVT occurrence     H/H decreased but expected with injury   plts stable,  continue to monitor    TED R leg for swelling  4. Medical issues per primary service  5. Pain management:  Low does narcs  Tylenol   6. ABL anemia/Hemodynamics  H/H stable  BP stable  7. DVT/PE prophylaxis:  As above   8. ID:   Completed periop abx  Urinalysis suspicious for UTI- start cipro 250 mg po q12h x 3 days. Suspect UTI on admission   9. Activity:  PT/OT evals  NWB L leg   10. FEN/Foley/Lines:  On protonix for gerd  Monitor food intake for persistent difficulty swallowing. If recurs may need swallow eval   11. Impediments to fracture healing:  Chronic disease  Chronic steroid use   12. Dispo:  PT/OT evals  Probable snf in 2-3 days    Mearl LatinKeith W. Paul, PA-C [facility] [phone] (P) 06/28/2013 11:13 AM

## 2013-06-28 NOTE — Evaluation (Signed)
Occupational Therapy Evaluation Patient Details Name: Maureen Ford MRN: [id number] DOB: June 14, 1946 Today's Date: 06/28/2013 Time: 1340-1410 OT Time Calculation (min): 30 min  OT Assessment / Plan / Recommendation History of present illness Pt fell at home sustaining L distal femur fx.  Pt underwent IM nailing yesterday, 06-27-12.   Clinical Impression   This 68 yo female admitted and underwent above presents to acute OT with decreased ROM LLE, NWB'ing LLE, decreased balance in standing, KI at all times--all affecting pts PLOF. Will benefit from acute OT with follow up HHOT.    OT Assessment  Patient needs continued OT Services    Follow Up Recommendations  Home health OT       Equipment Recommendations  3 in 1 bedside comode;Wheelchair (measurements OT);Wheelchair cushion (measurements OT) (elevating leg rests)       Frequency  Min 2X/week    Precautions / Restrictions Precautions Required Braces or Orthoses: Knee Immobilizer - Left Knee Immobilizer - Left: On when out of bed or walking Restrictions LLE Weight Bearing: Touchdown weight bearing       ADL  Eating/Feeding: Independent Where Assessed - Eating/Feeding: Edge of bed Grooming: Set up Where Assessed - Grooming: Unsupported sitting Upper Body Bathing: Set up Where Assessed - Upper Body Bathing: Unsupported sitting Lower Body Bathing: Moderate assistance Where Assessed - Lower Body Bathing: Supported sit to stand Upper Body Dressing: Set up Where Assessed - Upper Body Dressing: Unsupported sitting Lower Body Dressing: Maximal assistance Where Assessed - Lower Body Dressing: Supported sit to stand Toilet Transfer: Minimal assistance Toilet Transfer Method: Sit to Barista:  (Bed>10 hops forward>sit in recliner behind her) Toileting - Architect and Hygiene: Moderate assistance Where Assessed - Toileting Clothing Manipulation and Hygiene: Sit to stand from 3-in-1 or  toilet Equipment Used: Gait belt;Knee Immobilizer;Rolling walker Transfers/Ambulation Related to ADLs: min A for all with RW    OT Diagnosis: Generalized weakness  OT Problem List: Decreased strength;Decreased range of motion;Impaired balance (sitting and/or standing);Decreased knowledge of use of DME or AE OT Treatment Interventions: Self-care/ADL training;Patient/family education;DME and/or AE instruction;Balance training   OT Goals(Current goals can be found in the care plan section) Acute Rehab OT Goals Patient Stated Goal: home OT Goal Formulation: With patient Time For Goal Achievement: 07/05/13 Potential to Achieve Goals: Good  Visit Information  Last OT Received On: 06/28/13 Assistance Needed: +1 PT/OT/SLP Co-Evaluation/Treatment: Yes Reason for Co-Treatment: For patient/therapist safety PT goals addressed during session: Mobility/safety with mobility;Balance;Proper use of DME OT goals addressed during session: ADL's and self-care History of Present Illness: Pt fell at home sustaining L distal femur fx.  Pt underwent IM nailing yesterday, 06-27-12.       Prior Functioning     Home Living Family/patient expects to be discharged to:: Private residence Living Arrangements: Spouse/significant other Available Help at Discharge: Family;Available 24 hours/day Type of Home: House Home Access: Stairs to enter [employer] of Steps: 4 Entrance Stairs-Rails: Right Home Layout: One level Home Equipment: Hand held shower head Prior Function Level of Independence: Independent Communication Communication: No difficulties Dominant Hand:  (ambidextrous)         Vision/Perception Vision - History Patient Visual Report: No change from baseline   Cognition  Cognition Arousal/Alertness: Awake/alert Behavior During Therapy: WFL for tasks assessed/performed Overall Cognitive Status: Within Functional Limits for tasks assessed    Extremity/Trunk Assessment Upper  Extremity Assessment Upper Extremity Assessment: Overall WFL for tasks assessed (did c/o of slight pain left shoulder upon first hop)  Mobility Bed Mobility Bed Mobility: Supine to Sit Supine to Sit: 4: Min assist Details for Bed Mobility Assistance: verbal cues for sequencing Transfers Sit to Stand: 4: Min assist;From bed Stand to Sit: 4: Min assist;To chair/3-in-1;With armrests Details for Transfer Assistance: verbal cues for hand placement           End of Session OT - End of Session Equipment Utilized During Treatment: Gait belt;Rolling walker;Left knee immobilizer Activity Tolerance: Patient tolerated treatment well Patient left: in chair;with call bell/phone within reach;with family/visitor present Nurse Communication: Mobility status       Evette GeorgesLeonard, Emnet Monk Maureen Ford 06/28/2013, 2:47 PM

## 2013-06-28 NOTE — Progress Notes (Signed)
[facility] Progress Note     Maureen Ford [id number] DOB: 1946-05-17 DOA: 06/26/2013 PCP: Josue HectorNYLAND,LEONARD ROBERT, MD  Brief narrative: 68 year old female patient with medical history of hypertension, COPD and chronic steroids due to arthritis. Experienced a mechanical fall at home. Upon evaluation in the emergency department x-ray revealed a severely comminuted displaced and now lately angulated fracture of the distal femoral metadiaphysis. She was evaluated by orthopedic service and plans were to take to the OR on 06/27/2013. she reported following admission that she had right leg swelling since November. Doppler ultrasound was done on 1/2 and she was positive for DVT from the popliteal through the femoral and common femoral veins.  Assessment/Plan: Active Problems: Right lower extremity DVT -Continue therapeutic Lovenox, orthopedics okayed starting therapeutic Lovenox on 1/2 -follow and start on Coumadin when okay with ortho -improving clinically, follow   Fracture, femur, distal -s/p surgery- intramedullary femoral nailing  on 1/2 per Dr. Dion SaucierLandau  -Management per orthopedics    COPD (chronic obstructive pulmonary disease) -stable, continue when necessary bronchodilators -No wheezing preoperatively    Hypertension -on nevibilol, follow   Hypothyroidism -Continue Synthroid   Chronic steroid use -Patient remaining hemodynamically stable  -Will begin tapering stress dose steroids -currently on 100 q. 8 we'll decrease to 50 q. 8 and follow     Mood disorder   DVT prophylaxis: Her discretion of orthopedic surgical team-currently on Lovenox Code Status: Full Family Communication: none at bedside Disposition :pending postoperative physical therapy evaluation   Consultants: Orthopedics  Procedures: Left intramedullary retrograde femoral nailing  06/27/12  2-D echocardiogram pending Impressions:  - Normal LV size with vigorous systolic function, EF  65-70%. Normal RV size and systolic function. No significant valvular abnormalities.   Lower extremity venous duplex  - Findings consistent with acute deep vein thrombosis involving tyhe popliteal, femoral and common femoral veins of the the right lower extremity. Flow is minimal. There is a superficial thrombus of the saphenofemoral junction. - There is no obvious evidence of propagation to the left side at this time   Antibiotics: Clindamycin x1 preop  HPI/Subjective: Patient denies right leg pain, states swelling less.  Objective: Blood pressure 122/65, pulse 82, temperature 98.6 F (37 C), temperature source Oral, resp. rate 18, height 5\' 5"  (1.651 m), weight 63.504 kg (140 lb), SpO2 95.00%.  Intake/Output Summary (Last 24 hours) at 06/28/13 1005 Last data filed at 06/28/13 [id number]  Gross per 24 hour  Intake 3286.25 ml  Output    325 ml  Net 2961.25 ml      Exam:  General: alert & oriented x 3 In NAD Cardiovascular: RRR, nl S1 s2 Respiratory: CTAB Abdomen: soft +BS NT/ND, no masses palpable Extremities: +2RLE edema, left lower extremity with brace, No cyanosis    Scheduled Meds:  Scheduled Meds: . albuterol  1-2 puff Inhalation TID  . docusate sodium  100 mg Oral BID  . enoxaparin (LOVENOX) injection  60 mg Subcutaneous Q12H  . hydrocortisone sod succinate (SOLU-CORTEF) inj  50 mg Intravenous Q8H  . levothyroxine  25 mcg Oral QAC breakfast  . nebivolol  2.5 mg Oral Daily  . ondansetron (ZOFRAN) IV  4 mg Intravenous Once  . pantoprazole (PROTONIX) IV  40 mg Intravenous Q24H  . pneumococcal 23 valent vaccine  0.5 mL Intramuscular Tomorrow-1000  . senna  1 tablet Oral BID  . sertraline  50 mg Oral Daily  . tiotropium  18 mcg Inhalation Daily   Continuous Infusions: . 0.45 % NaCl with KCl 20  mEq / L 20 mL/hr at 06/28/13 0803     Data Reviewed: Basic Metabolic Panel:  Recent Labs Lab 06/26/13 2346 06/27/13 1900 06/28/13 0525  NA 139  --  138  K 4.3   --  4.5  CL 101  --  102  CO2 25  --  22  GLUCOSE 126*  --  111*  BUN 21  --  17  CREATININE 0.99 1.06 1.05  CALCIUM 9.5  --  8.4   Liver Function Tests: No results found for this basename: AST, ALT, ALKPHOS, BILITOT, PROT, ALBUMIN,  in the last 168 hours No results found for this basename: LIPASE, AMYLASE,  in the last 168 hours No results found for this basename: AMMONIA,  in the last 168 hours CBC:  Recent Labs Lab 06/26/13 2346 06/27/13 1900 06/28/13 0525  WBC 9.7 13.1* 10.9*  NEUTROABS 8.1*  --   --   HGB 13.2 11.1* 10.3*  HCT 38.6 33.4* 30.9*  MCV 102.9* 104.0* 104.7*  PLT 183 164 162   Cardiac Enzymes: No results found for this basename: CKTOTAL, CKMB, CKMBINDEX, TROPONINI,  in the last 168 hours BNP (last 3 results) No results found for this basename: PROBNP,  in the last 8760 hours CBG: No results found for this basename: GLUCAP,  in the last 168 hours  Recent Results (from the past 240 hour(s))  SURGICAL PCR SCREEN     Status: None   Collection Time    06/27/13  6:34 AM      Result Value Range Status   MRSA, PCR NEGATIVE  NEGATIVE Final   Staphylococcus aureus NEGATIVE  NEGATIVE Final   Comment:            The Xpert SA Assay (FDA     approved for NASAL specimens     in patients over 52 years of age),     is one component of     a comprehensive surveillance     program.  Test performance has     been validated by [employer] for patients greater     than or equal to 28 year old.     It is not intended     to diagnose infection nor to     guide or monitor treatment.     Studies:  I have reviewed all Recent x-ray studies and labs in detail.     Donnalee Curry MD [facility] Office  [phone] Pager [phone]  **If unable to reach the above provider after paging please contact the Flow Manager @ [phone]  On-Call/Text Page:      [email or web address]      password [email or web address]  If 7PM-7AM, please contact  night-coverage [email or web address] Password [email or web address] 06/28/2013, 10:05 AM   LOS: 2 days

## 2013-06-28 NOTE — Evaluation (Signed)
Physical Therapy Evaluation Patient Details Name: Maureen Ford MRN: [id number] DOB: Nov 01, 1945 Today's Date: 06/28/2013 Time: 1340-1410 PT Time Calculation (min): 30 min  PT Assessment / Plan / Recommendation History of Present Illness  Pt fell at home sustaining L distal femur fx.  Pt underwent IM nailing yesterday, 06-27-12.  Clinical Impression  Patient is s/p IM nailing surgery L femur resulting in functional limitations due to the deficits listed below (see PT Problem List).  Patient will benefit from skilled PT to increase their independence and safety with mobility to allow discharge to the venue listed below. Pt's desire is home with HHPT.  May need to consider ST SNF pending progress.  Pt aware.      PT Assessment  Patient needs continued PT services    Follow Up Recommendations  Home health PT (pt may need SNF pending progress)    Does the patient have the potential to tolerate intense rehabilitation      Barriers to Discharge        Equipment Recommendations  Rolling walker with 5" wheels;3in1 (PT);Wheelchair (measurements PT)    Recommendations for Other Services     Frequency Min 5X/week    Precautions / Restrictions Precautions Required Braces or Orthoses: Knee Immobilizer - Left Knee Immobilizer - Left: On when out of bed or walking Restrictions LLE Weight Bearing: Touchdown weight bearing   Pertinent Vitals/Pain 3/10      Mobility  Bed Mobility Bed Mobility: Supine to Sit Supine to Sit: 4: Min assist Details for Bed Mobility Assistance: verbal cues for sequencing Transfers Transfers: Sit to Stand;Stand to Sit Sit to Stand: 4: Min assist;From bed Stand to Sit: 4: Min assist;To chair/3-in-1;With armrests Details for Transfer Assistance: verbal cues for hand placement Ambulation/Gait Ambulation/Gait Assistance: 4: Min assist Ambulation Distance (Feet): 10 Feet Assistive device: Rolling walker Gait Pattern: Step-to pattern Gait velocity:  decreased    Exercises     PT Diagnosis: Difficulty walking;Acute pain  PT Problem List: Decreased activity tolerance;Decreased mobility;Decreased knowledge of precautions;Decreased knowledge of use of DME;Pain PT Treatment Interventions: DME instruction;Gait training;Stair training;Functional mobility training;Therapeutic activities;Therapeutic exercise;Balance training;Patient/family education     PT Goals(Current goals can be found in the care plan section) Acute Rehab PT Goals Patient Stated Goal: home PT Goal Formulation: With patient Time For Goal Achievement: 07/05/13 Potential to Achieve Goals: Good  Visit Information  Last PT Received On: 06/28/13 Assistance Needed: +1 (+2 for lines/safety with ambulation) PT/OT/SLP Co-Evaluation/Treatment: Yes Reason for Co-Treatment: For patient/therapist safety PT goals addressed during session: Mobility/safety with mobility;Balance;Proper use of DME History of Present Illness: Pt fell at home sustaining L distal femur fx.  Pt underwent IM nailing yesterday, 06-27-12.       Prior Functioning  Home Living Family/patient expects to be discharged to:: Private residence Living Arrangements: Spouse/significant other Available Help at Discharge: Family;Available 24 hours/day Type of Home: House Home Access: Stairs to enter [employer] Corporation[employer]trance Stairs-Number of Steps: 4 Entrance Stairs-Rails: Right Home Layout: One level Home Equipment: Hand held shower head Prior Function Level of Independence: Independent Communication Communication: No difficulties Dominant Hand:  (ambidextrous)    Cognition  Cognition Arousal/Alertness: Awake/alert Behavior During Therapy: WFL for tasks assessed/performed Overall Cognitive Status: Within Functional Limits for tasks assessed    Extremity/Trunk Assessment     Balance    End of Session PT - End of Session Equipment Utilized During Treatment: Gait belt Activity Tolerance: Patient tolerated treatment  well Patient left: in chair;with call bell/phone within reach;with family/visitor present Nurse Communication:  Mobility status  GP     Ilda Foil 06/28/2013, 2:42 PM  Aida Raider, PT  Office # [phone] Pager [phone]

## 2013-06-29 DIAGNOSIS — S8290XS Unspecified fracture of unspecified lower leg, sequela: Secondary | ICD-10-CM

## 2013-06-29 LAB — CBC
HCT: 27.3 % — ABNORMAL LOW (ref 36.0–46.0)
HEMOGLOBIN: 9.2 g/dL — AB (ref 12.0–15.0)
MCH: 35.5 pg — AB (ref 26.0–34.0)
MCHC: 33.7 g/dL (ref 30.0–36.0)
MCV: 105.4 fL — AB (ref 78.0–100.0)
Platelets: 159 10*3/uL (ref 150–400)
RBC: 2.59 MIL/uL — AB (ref 3.87–5.11)
RDW: 14 % (ref 11.5–15.5)
WBC: 10.1 10*3/uL (ref 4.0–10.5)

## 2013-06-29 LAB — BASIC METABOLIC PANEL
BUN: 18 mg/dL (ref 6–23)
CALCIUM: 8.5 mg/dL (ref 8.4–10.5)
CO2: 21 meq/L (ref 19–32)
CREATININE: 1.02 mg/dL (ref 0.50–1.10)
Chloride: 109 mEq/L (ref 96–112)
GFR calc Af Amer: 64 mL/min — ABNORMAL LOW (ref >90)
GFR calc non Af Amer: 56 mL/min — ABNORMAL LOW (ref >90)
GLUCOSE: 108 mg/dL — AB (ref 70–99)
Potassium: 4.1 mEq/L (ref 3.7–5.3)
Sodium: 142 mEq/L (ref 137–147)

## 2013-06-29 LAB — PROTIME-INR
INR: 1.23 (ref 0.00–1.49)
Prothrombin Time: 15.2 seconds (ref 11.6–15.2)

## 2013-06-29 MED ORDER — CIPROFLOXACIN HCL 250 MG PO TABS
250.0000 mg | ORAL_TABLET | Freq: Two times a day (BID) | ORAL | Status: DC
  Administered 2013-06-29 – 2013-07-01 (×5): 250 mg via ORAL
  Filled 2013-06-29 (×6): qty 1

## 2013-06-29 MED ORDER — WARFARIN SODIUM 4 MG PO TABS
4.0000 mg | ORAL_TABLET | Freq: Once | ORAL | Status: AC
  Administered 2013-06-29: 4 mg via ORAL
  Filled 2013-06-29: qty 1

## 2013-06-29 MED ORDER — WARFARIN SODIUM 5 MG PO TABS
5.0000 mg | ORAL_TABLET | Freq: Once | ORAL | Status: DC
  Filled 2013-06-29: qty 1

## 2013-06-29 NOTE — Progress Notes (Signed)
Clinical Social Work Department BRIEF PSYCHOSOCIAL ASSESSMENT 06/29/2013  Patient:  ANGELIZE, RYCE     Account Number:  [id number]     Admit date:  06/26/2013  Clinical Social Worker:  Rolinda Roan  Date/Time:  06/29/2013 06:25 PM  Referred by:  Physician  Date Referred:  06/28/2013 Referred for  SNF Placement   Other Referral:   Interview type:  Patient Other interview type:    PSYCHOSOCIAL DATA Living Status:  HUSBAND Admitted from facility:   Level of care:   Primary support name:  Otis Peak Primary support relationship to patient:  SPOUSE Degree of support available:   Patient reported that she lives with her husband Otis Peak but she did not change her last name.    CURRENT CONCERNS  Other Concerns:    SOCIAL WORK ASSESSMENT / PLAN Clinical Social Worker (CSW) met with patient to discuss SNF placement. Patient agreeable to SNF search in [facility]. Patient does not have a facility preference at this point.   Assessment/plan status:  Psychosocial Support/Ongoing Assessment of Needs Other assessment/ plan:   Information/referral to community resources:   CSW gave patient SNF list.    PATIENT'S/FAMILY'S RESPONSE TO PLAN OF CARE: Patient thanked CSW for visit.

## 2013-06-29 NOTE — Progress Notes (Signed)
Physical Therapy Treatment Patient Details Name: Maureen Ford MRN: [id number] DOB: Dec 08, 1945 Today's Date: 06/29/2013 Time: [id number] PT Time Calculation (min): 19 min  PT Assessment / Plan / Recommendation  History of Present Illness Pt fell at home sustaining L distal femur fx.  Pt underwent IM nailing yesterday, 06-27-12.   PT Comments   Pt very pleasant & willing to participate in therapy.  Progressing with mobility.  Cont with current POC to maximize safety & functional mobility prior to d/c home.     Follow Up Recommendations  Home health PT     Does the patient have the potential to tolerate intense rehabilitation     Barriers to Discharge        Equipment Recommendations  Rolling walker with 5" wheels;3in1 (PT);Wheelchair (measurements PT)    Recommendations for Other Services    Frequency Min 5X/week   Progress towards PT Goals Progress towards PT goals: Progressing toward goals  Plan Current plan remains appropriate    Precautions / Restrictions Restrictions Weight Bearing Restrictions: Yes LLE Weight Bearing: Touchdown weight bearing   Pertinent Vitals/Pain "I don't have any right now".      Mobility  Bed Mobility Bed Mobility: Not assessed Transfers Transfers: Sit to Stand;Stand to Sit Sit to Stand: 4: Min guard;With upper extremity assist;With armrests;From chair/3-in-1 Stand to Sit: 4: Min guard;With upper extremity assist;With armrests;To chair/3-in-1 Details for Transfer Assistance: cues for hand placement & LLE positioning Ambulation/Gait Ambulation/Gait Assistance: 4: Min guard Ambulation Distance (Feet): 25 Feet Assistive device: Rolling walker Ambulation/Gait Assistance Details: cues for sequencing, TDWBing, & RW advancement.   Gait Pattern: Step-through pattern Gait velocity: decreased Stairs: No Wheelchair Mobility Wheelchair Mobility: No      PT Goals (current goals can now be found in the care plan section) Acute Rehab PT  Goals Patient Stated Goal: home PT Goal Formulation: With patient Time For Goal Achievement: 07/05/13 Potential to Achieve Goals: Good  Visit Information  Last PT Received On: 06/29/13 Assistance Needed: +1 History of Present Illness: Pt fell at home sustaining L distal femur fx.  Pt underwent IM nailing yesterday, 06-27-12.    Subjective Data  Patient Stated Goal: home   Cognition  Cognition Arousal/Alertness: Awake/alert Behavior During Therapy: WFL for tasks assessed/performed Overall Cognitive Status: Within Functional Limits for tasks assessed    Balance     End of Session PT - End of Session Equipment Utilized During Treatment: Left knee immobilizer Activity Tolerance: Patient tolerated treatment well Patient left: in chair;with call bell/phone within reach;with nursing/sitter in room Nurse Communication: Mobility status   GP     Lara MulchCooper, Kirandeep Fariss Lynn 06/29/2013, 12:02 PM   Maureen Ford, PTA [phone] 06/29/2013

## 2013-06-29 NOTE — Progress Notes (Signed)
[facility] Progress Note WEEKEND COVERAGE  Subjective  Doing well Pain controlled No other issues noted   Review of Systems  Constitutional: Negative for fever and chills.  Respiratory: Negative for shortness of breath and wheezing.   Cardiovascular: Negative for chest pain.  Gastrointestinal: Negative for nausea, vomiting and abdominal pain.  Neurological: Negative for tingling, sensory change and headaches.     Objective   BP 95/74  Pulse 107  Temp(Src) 98 F (36.7 C) (Oral)  Resp 18  Ht 5\' 5"  (1.651 m)  Wt 63.504 kg (140 lb)  BMI 23.30 kg/m2  SpO2 100%  Intake/Output     01/03 0701 - 01/04 0700 01/04 0701 - 01/05 0700   Ford.O. 600    I.V. (mL/kg)     IV Piggyback     Total Intake(mL/kg) 600 (9.4)    Urine (mL/kg/hr)     Blood     Total Output       Net +600          Urine Occurrence 6 x      Labs Results for Maureen Ford, Maureen Ford (MRN [id number]) as of 06/29/2013 10:58  Ref. Range 06/29/2013 06:20  Sodium Latest Range: 137-147 mEq/L 142  Potassium Latest Range: 3.7-5.3 mEq/L 4.1  Chloride Latest Range: 96-112 mEq/L 109  CO2 Latest Range: 19-32 mEq/L 21  BUN Latest Range: 6-23 mg/dL 18  Creatinine Latest Range: 0.50-1.10 mg/dL 4.091.02  Calcium Latest Range: 8.4-10.5 mg/dL 8.5  GFR calc non Af Amer Latest Range: >90 mL/min 56 (L)  GFR calc Af Amer Latest Range: >90 mL/min 64 (L)  Glucose Latest Range: 70-99 mg/dL 811108 (H)  WBC Latest Range: 4.0-10.5 K/uL 10.1  RBC Latest Range: 3.87-5.11 MIL/uL 2.59 (L)  Hemoglobin Latest Range: 12.0-15.0 g/dL 9.2 (L)  HCT Latest Range: 36.0-46.0 % 27.3 (L)  MCV Latest Range: 78.0-100.0 fL 105.4 (H)  MCH Latest Range: 26.0-34.0 pg 35.5 (H)  MCHC Latest Range: 30.0-36.0 g/dL 91.433.7  RDW Latest Range: 11.5-15.5 % 14.0  Platelets Latest Range: 150-400 K/uL 159  Prothrombin Time Latest Range: 11.6-15.2 seconds 15.2  INR Latest Range: 0.00-1.49  1.23     Exam  Gen: sitting in bedside chair, NAD, comfortable   Ext:  Right lower extremity               + swelling               No DCT             Distal motor and sensory functions intact             Ext warm             + DP pulse                          Will have knee high ted placed       Left Lower Extremity             Dressing c/d/i             Distal motor and sensory functions intact             Knee immobilizer in place             Ice to L thigh             Ext warm             + DP pulse  Assessment and Plan   POD/HD#: 2  68 y/o female s/Ford fall with L distal femur fx and R leg DVT on admission   1. Fall  2. L distal femur fx s/Ford IMN             NWB             Dressing change as needed             PT/OT evals             Ice and elevate             Toe and ankle ROM as tolerated             Knee immobilizer on when working with therapy   Knee ROM as tolerated, HEP   3. R leg DVT             Started on therapeutic lovenox yesterday after called about + doppler             lovenox bridge to coumadin              Will need to be covered with therapeutic lovenox for at least 5 days and for INR >2 for  24 hrs  INR subtherapeutic              PCP is dr. Lysbeth Galas in [place]               Recommend 3-6 months of tx as this is first DVT occurrence                            H/H decreased but expected with injury              plts stable, continue to monitor                          TED R leg for swelling  4. Medical issues per primary service  5. Pain management:             Low does narcs             Tylenol   6. ABL anemia/Hemodynamics             H/H stable             BP stable  7. DVT/PE prophylaxis:             As above   8. ID:               Completed periop abx             Urinalysis suspicious for UTI- start cipro 250 mg po q12h x 3 days. Suspect UTI on admission   9. Activity:             PT/OT evals             NWB L leg   10. FEN/Foley/Lines:             On protonix for  gerd             did not report any further issues with swallowing   11. Impediments to fracture healing:             Chronic disease             Chronic steroid use  12. Dispo:             PT/OT evals             Probable snf in 2-3 days   Mearl Latin, PA-C [facility] [phone] (Ford) 06/29/2013 11:10 AM

## 2013-06-29 NOTE — Progress Notes (Addendum)
ANTICOAGULATION CONSULT NOTE - Follow Up Consult  Pharmacy Consult for Lovenox and Coumadin Indication: DVT  Allergies  Allergen Reactions  . Penicillins     *childhood allergy*  . Sulfa Antibiotics Hives    Patient Measurements: Height: 5\' 5"  (165.1 cm) Weight: 140 lb (63.504 kg) IBW/kg (Calculated) : 57 Heparin Dosing Weight:   Vital Signs: Temp: 98 F (36.7 C) (01/04 0634) Temp src: Oral (01/04 0634) BP: 95/74 mmHg (01/04 0634) Pulse Rate: 107 (01/04 0634)  Labs:  Recent Labs  06/26/13 2346 06/27/13 1900 06/28/13 0525 06/29/13 0620  HGB 13.2 11.1* 10.3* 9.2*  HCT 38.6 33.4* 30.9* 27.3*  PLT 183 164 162 159  APTT 23*  --   --   --   LABPROT 13.3  --   --  15.2  INR 1.03  --   --  1.23  CREATININE 0.99 1.06 1.05 1.02    Estimated Creatinine Clearance: 48.2 ml/min (by C-G formula based on Cr of 1.02).   Medications:  Lovenox 60mg  q12h  Assessment: 67yof on Lovenox bridging to Coumadin (Day 2 of minimum 5 Day overlap) for acute RLE DVT. INR (1.23) is subtherapeutic as expected after 1 dose. Ciprofloxacin has been started for suspected UTI - monitor INR closely as can increase INR. Continue Lovenox 60mg  q12h - dosing appropriate. - H/H trending down, Plts stable - No significant bleeding reported  Goal of Therapy:  INR 2-3 Anti-Xa level 0.6-1 units/ml 4hrs after LMWH dose given Monitor platelets by anticoagulation protocol: Yes   Plan:  1. Continue Lovenox 60mg  q12h 2. Coumadin 4mg  po x 1 today 3. Daily INR, CBC   Cleon DewDulaney,  Robert [phone] 06/29/2013,10:55 AM

## 2013-06-29 NOTE — Progress Notes (Signed)
[facility] Progress Note     Maureen Ford RUE:[id number] DOB: [birth year] DOA: 06/26/2013 PCP: Josue Hector, MD  Brief narrative: 68 year old female patient with medical history of hypertension, COPD and chronic steroids due to arthritis. Experienced a mechanical fall at home. Upon evaluation in the emergency department x-ray revealed a severely comminuted displaced and now lately angulated fracture of the distal femoral metadiaphysis. She was evaluated by orthopedic service and plans were to take to the OR on 06/27/2013. she reported following admission that she had right leg swelling since November. Doppler ultrasound was done on 1/2 and she was positive for DVT from the popliteal through the femoral and common femoral veins.  Assessment/Plan: Active Problems: Right lower extremity DVT - Continue therapeutic Lovenox, orthopedics okayed starting therapeutic Lovenox on 1/2 - day 2 of coumadin on 06/29/13 - Follow INR - improving clinically, follow   Fracture, femur, distal -s/p surgery- intramedullary femoral nailing  on 1/2 per Dr. Dion Saucier  -Management per orthopedics    COPD (chronic obstructive pulmonary disease) -stable, continue when necessary bronchodilators -No wheezing preoperatively    Hypertension -on nevibilol, follow   Hypothyroidism -Continue Synthroid   Chronic steroid use -Patient remaining hemodynamically stable  -Will begin tapering stress dose steroids -currently on 100 q. 8 we'll decrease to 50 q. 8 and follow     Mood disorder   DVT prophylaxis: Her discretion of orthopedic surgical team-currently on Lovenox Code Status: Full Family Communication: none at bedside Disposition :pending postoperative physical therapy evaluation   Consultants: Orthopedics  Procedures: Left intramedullary retrograde femoral nailing  06/27/12  2-D echocardiogram pending Impressions:  - Normal LV size with vigorous systolic function, EF 65-70%. Normal  RV size and systolic function. No significant valvular abnormalities.   Lower extremity venous duplex  - Findings consistent with acute deep vein thrombosis involving tyhe popliteal, femoral and common femoral veins of the the right lower extremity. Flow is minimal. There is a superficial thrombus of the saphenofemoral junction. - There is no obvious evidence of propagation to the left side at this time   Antibiotics: Clindamycin x1 preop  HPI/Subjective: No complaints.  Objective: Blood pressure 95/74, pulse 107, temperature 98 F (36.7 C), temperature source Oral, resp. rate 18, height 5\' 5"  (1.651 m), weight 63.504 kg (140 lb), SpO2 100.00%.  Intake/Output Summary (Last 24 hours) at 06/29/13 1108 Last data filed at 06/29/13 1478  Gross per 24 hour  Intake    480 ml  Output      0 ml  Net    480 ml   Exam:  General: alert & oriented x 3 In NAD Cardiovascular: RRR, nl S1 s2 Respiratory: CTAB Abdomen: soft +BS NT/ND, no masses palpable Extremities: +2RLE edema, left lower extremity with brace, No cyanosis   Scheduled Meds:  Scheduled Meds: . albuterol  1-2 puff Inhalation TID  . ciprofloxacin  250 mg Oral BID  . coumadin book   Does not apply Once  . docusate sodium  100 mg Oral BID  . enoxaparin (LOVENOX) injection  60 mg Subcutaneous Q12H  . hydrocortisone sod succinate (SOLU-CORTEF) inj  50 mg Intravenous Q8H  . levothyroxine  25 mcg Oral QAC breakfast  . nebivolol  2.5 mg Oral Daily  . ondansetron (ZOFRAN) IV  4 mg Intravenous Once  . pantoprazole (PROTONIX) IV  40 mg Intravenous Q24H  . pneumococcal 23 valent vaccine  0.5 mL Intramuscular Tomorrow-1000  . senna  1 tablet Oral BID  . sertraline  50 mg Oral Daily  .  tiotropium  18 mcg Inhalation Daily  . warfarin  4 mg Oral ONCE-1800  . warfarin   Does not apply Once  . Warfarin - Pharmacist Dosing Inpatient   Does not apply q1800   Continuous Infusions: . 0.45 % NaCl with KCl 20 mEq / L 20 mL/hr at  06/28/13 0803     Data Reviewed: Basic Metabolic Panel:  Recent Labs Lab 06/26/13 2346 06/27/13 1900 06/28/13 0525 06/29/13 0620  NA 139  --  138 142  K 4.3  --  4.5 4.1  CL 101  --  102 109  CO2 25  --  22 21  GLUCOSE 126*  --  111* 108*  BUN 21  --  17 18  CREATININE 0.99 1.06 1.05 1.02  CALCIUM 9.5  --  8.4 8.5   Liver Function Tests: No results found for this basename: AST, ALT, ALKPHOS, BILITOT, PROT, ALBUMIN,  in the last 168 hours No results found for this basename: LIPASE, AMYLASE,  in the last 168 hours No results found for this basename: AMMONIA,  in the last 168 hours CBC:  Recent Labs Lab 06/26/13 2346 06/27/13 1900 06/28/13 0525 06/29/13 0620  WBC 9.7 13.1* 10.9* 10.1  NEUTROABS 8.1*  --   --   --   HGB 13.2 11.1* 10.3* 9.2*  HCT 38.6 33.4* 30.9* 27.3*  MCV 102.9* 104.0* 104.7* 105.4*  PLT 183 164 162 159   Cardiac Enzymes: No results found for this basename: CKTOTAL, CKMB, CKMBINDEX, TROPONINI,  in the last 168 hours BNP (last 3 results) No results found for this basename: PROBNP,  in the last 8760 hours CBG: No results found for this basename: GLUCAP,  in the last 168 hours  Recent Results (from the past 240 hour(s))  SURGICAL PCR SCREEN     Status: None   Collection Time    06/27/13  6:34 AM      Result Value Range Status   MRSA, PCR NEGATIVE  NEGATIVE Final   Staphylococcus aureus NEGATIVE  NEGATIVE Final   Comment:            The Xpert SA Assay (FDA     approved for NASAL specimens     in patients over 68 years of age),     is one component of     a comprehensive surveillance     program.  Test performance has     been validated by [facility] for patients greater     than or equal to 68 year old.     It is not intended     to diagnose infection nor to     guide or monitor treatment.     Studies:    Rickey BarbaraStephen Chiu, MD [facility] Office  [phone]  **If unable to reach the above provider after paging please  contact the Flow Manager @ [phone]  On-Call/Text Page:      [email or web address]      password [email or web address]  If 7PM-7AM, please contact night-coverage [email or web address] Password [email or web address] 06/29/2013, 11:08 AM   LOS: 3 days

## 2013-06-29 NOTE — Clinical Social Work Placement (Addendum)
    Clinical Social Work Department CLINICAL SOCIAL WORK PLACEMENT NOTE 06/29/2013  Patient:  Maureen Ford,Maureen Ford  Account Number:  [id number] Admit date:  06/26/2013  Clinical Social Worker:  Lupita LeashNNA CROWDER, LCSWA  Date/time:  06/29/2013 06:09 PM  Clinical Social Work is seeking post-discharge placement for this patient at the following level of care:   SKILLED NURSING   (*CSW will update this form in Epic as items are completed)   06/29/2013  Patient/family provided with [facility] of Clinical Social Work's list of facilities offering this level of care within the geographic area requested by the patient (or if unable, by the patient's family).  06/29/2013  Patient/family informed of their freedom to choose among providers that offer the needed level of care, that participate in Medicare, Medicaid or managed care program needed by the patient, have an available bed and are willing to accept the patient.  06/29/2013  Patient/family informed of [facility]' ownership interest in [facility], as well as of the fact that they are under no obligation to receive care at this facility.  PASARR submitted to EDS on 06/29/2013 PASARR number received from EDS on   FL2 transmitted to all facilities in geographic area requested by pt/family on   FL2 transmitted to all facilities within larger geographic area on   Patient informed that his/her managed care company has contracts with or will negotiate with  certain facilities, including the following:   NA     Patient/family informed of bed offers received:   Patient chooses bed at  Physician recommends and patient chooses bed at    Patient to be transferred to HOME  on   Patient to be transferred to facility by   The following physician request were entered in Epic:   Additional Comments:

## 2013-06-30 DIAGNOSIS — N390 Urinary tract infection, site not specified: Secondary | ICD-10-CM | POA: Diagnosis present

## 2013-06-30 LAB — URINE CULTURE

## 2013-06-30 LAB — CBC
HEMATOCRIT: 27.4 % — AB (ref 36.0–46.0)
HEMOGLOBIN: 9.1 g/dL — AB (ref 12.0–15.0)
MCH: 35.3 pg — ABNORMAL HIGH (ref 26.0–34.0)
MCHC: 33.2 g/dL (ref 30.0–36.0)
MCV: 106.2 fL — AB (ref 78.0–100.0)
Platelets: 209 10*3/uL (ref 150–400)
RBC: 2.58 MIL/uL — ABNORMAL LOW (ref 3.87–5.11)
RDW: 14.1 % (ref 11.5–15.5)
WBC: 10.4 10*3/uL (ref 4.0–10.5)

## 2013-06-30 LAB — PROTIME-INR
INR: 1.29 (ref 0.00–1.49)
Prothrombin Time: 15.8 seconds — ABNORMAL HIGH (ref 11.6–15.2)

## 2013-06-30 MED ORDER — HYDROCORTISONE SOD SUCCINATE 100 MG IJ SOLR
50.0000 mg | Freq: Two times a day (BID) | INTRAMUSCULAR | Status: DC
  Filled 2013-06-30: qty 1

## 2013-06-30 MED ORDER — WARFARIN SODIUM 5 MG PO TABS
5.0000 mg | ORAL_TABLET | Freq: Once | ORAL | Status: DC
  Filled 2013-06-30: qty 1

## 2013-06-30 MED ORDER — RIVAROXABAN 15 MG PO TABS
15.0000 mg | ORAL_TABLET | Freq: Two times a day (BID) | ORAL | Status: DC
  Administered 2013-06-30 – 2013-07-01 (×2): 15 mg via ORAL
  Filled 2013-06-30 (×4): qty 1

## 2013-06-30 MED ORDER — HYDROCORTISONE SOD SUCCINATE 100 MG IJ SOLR
50.0000 mg | Freq: Once | INTRAMUSCULAR | Status: DC
  Filled 2013-06-30: qty 1

## 2013-06-30 MED ORDER — PANTOPRAZOLE SODIUM 40 MG PO TBEC
40.0000 mg | DELAYED_RELEASE_TABLET | Freq: Every day | ORAL | Status: DC
  Administered 2013-06-30 – 2013-07-01 (×2): 40 mg via ORAL
  Filled 2013-06-30 (×2): qty 1

## 2013-06-30 MED ORDER — PREDNISONE 5 MG PO TABS
5.0000 mg | ORAL_TABLET | Freq: Every day | ORAL | Status: DC
  Administered 2013-07-01: 5 mg via ORAL
  Filled 2013-06-30 (×2): qty 1

## 2013-06-30 NOTE — Progress Notes (Signed)
     Subjective:  Patient reports pain as mild. She wants to go home, and does not want to go to skilled nursing facility. She has been able to get to a stand with minimal assistance with physical therapy, however angulation has been challenging.  Objective:   VITALS:   Filed Vitals:   06/29/13 1331 06/29/13 2039 06/29/13 2054 06/30/13 0405  BP: 91/53 120/55  132/69  Pulse: 95 84  80  Temp: 97.7 F (36.5 C) 98 F (36.7 C)  98.2 F (36.8 C)  TempSrc:  Oral  Oral  Resp: 18 18  18   Height:      Weight:      SpO2: 100% 98% 96% 98%    Neurologically intact Dorsiflexion/Plantar flexion intact Incision: dressing C/D/I   Lab Results  Component Value Date   WBC 10.4 06/30/2013   HGB 9.1* 06/30/2013   HCT 27.4* 06/30/2013   MCV 106.2* 06/30/2013   PLT 209 06/30/2013     Assessment/Plan: 3 Days Post-Op   Active Problems:   Fracture, femur, distal   Chronic steroid use   Hypertension   Hypothyroidism   Mood disorder   COPD (chronic obstructive pulmonary disease)   Advance diet Up with therapy Discharge to SNF versus home, depending on her capacity with physical therapy. The family wishes for her to go to skilled nursing, although the patient is very adamant that she wants to go home. I would defer the ultimate discharged incision to the primary team. She will need to followup with me in 2 weeks. Acute blood loss anemia, mild, observed.  Jabre Heo P 06/30/2013, 8:15 AM   Teryl LucyJoshua Bronda Alfred, MD Cell [phone]

## 2013-06-30 NOTE — Progress Notes (Signed)
Called MD re; Maureen HurlXarelto approval per MD request.  [facility].  Coumadin not given and discontinued along with Lovenox.  Nsg to continue to monitor for status changes.

## 2013-06-30 NOTE — Progress Notes (Signed)
Occupational Therapy Treatment Patient Details Name: Maureen KohMarianne P Dallaire MRN: [id number] DOB: [birth year] Today's Date: 06/30/2013 Time: [id number] OT Time Calculation (min): 52 min  OT Assessment / Plan / Recommendation  History of present illness Pt fell at home sustaining L distal femur fx.  Pt underwent IM nailing yesterday, 06-27-12.   OT comments  This [age] yo female making progress, pt wants to go home, but [facility] I am recommending HHOT either way.   Follow Up Recommendations  Home health OT       Equipment Recommendations  3 in 1 bedside comode;Wheelchair cushion (measurements OT);Wheelchair (measurements OT) (elevating leg rests for W/C; RW)       Frequency Min 2X/week   Progress towards OT Goals Progress towards OT goals: Progressing toward goals  Plan Discharge plan remains appropriate    Precautions / Restrictions Precautions Precautions: Fall Required Braces or Orthoses: Knee Immobilizer - Left Knee Immobilizer - Left: On at all times Restrictions LLE Weight Bearing: Touchdown weight bearing       ADL  Grooming: Min guard Where Assessed - Grooming: Unsupported standing Lower Body Dressing: Min guard Where Assessed - Lower Body Dressing: Unsupported sit to stand Toilet Transfer: Min Pension scheme managerguard Toilet Transfer Method: Sit to Baristastand Toilet Transfer Equipment: Comfort height toilet;Grab bars Toileting - ArchitectClothing Manipulation and Hygiene: Min guard Where Assessed - Engineer, miningToileting Clothing Manipulation and Hygiene: Sit to stand from 3-in-1 or toilet Equipment Used: Rolling walker;Knee Immobilizer Transfers/Ambulation Related to ADLs: min guard A with RW ADL Comments: educated on use of reacher to doff socks and don underwear/pants; sock aide to don socks. Pt reports that she has a reacher at home, but will get a sock aide from the gift shop. Educated pt on washing her LLE in bed or reclined in recliner so as to keep leg straight. Talked to her about  wearing a gown and underwear with pad at home for ease of  toileting.      OT Goals(current goals can now be found in the care plan section)    Visit Information  Last OT Received On: 06/30/13 Assistance Needed: +1 History of Present Illness: Pt fell at home sustaining L distal femur fx.  Pt underwent IM nailing yesterday, 06-27-12.          Cognition  Cognition Arousal/Alertness: Awake/alert Behavior During Therapy: WFL for tasks assessed/performed Overall Cognitive Status: Within Functional Limits for tasks assessed    Mobility  Bed Mobility Details for Bed Mobility Assistance: Pt up in recliner upon arrival          End of Session OT - End of Session Equipment Utilized During Treatment: Rolling walker;Left knee immobilizer Activity Tolerance: Patient tolerated treatment well Patient left: in chair;with call bell/phone within reach    Evette GeorgesLeonard, Zaylie Gisler Eva [phone] 06/30/2013, 10:39 AM

## 2013-06-30 NOTE — Care Management Note (Deleted)
CARE MANAGEMENT NOTE 06/30/2013  Comments:  06/30/13 2:30pm Vance PeperSusan Aza Dantes, RN BSN Case Manager CM received a call from Tobi BastosAnna, [place] with [employer] [phone]. Asked that information be faxed to her at [phone]. With permission of patient info was faxed. CM also provided Tobi BastosAnna with contact number for Zelphia CairoGenie Logan, Inpatient rehab coordinator. Waiting for determination regarding patient going to Inpatient rehab.

## 2013-06-30 NOTE — Plan of Care (Signed)
Received a call from RN that patient has been approved for xarelto. Will DC lovenox and coumadin. Start xarelto tonight if okay with pharmacy.  Hopefully DC home/SNF tomorrow.   Daylyn Christine M.D. [facility] 06/30/2013, 6:48 PM  Pager: [phone]

## 2013-06-30 NOTE — Progress Notes (Signed)
ANTICOAGULATION CONSULT FOR XARELTO Indication: New DVT Lovenox and coumadin were d/c'd prior to any doses given this evening. Pt was started on Xarelto 15 mg BID tonight for 42 doses and then change to 20 mg daily. Her INR today was 1.29. Pharmacy will provide education on taking Xarelto for the pt.  Cardell PeachBarbara Daegen Berrocal, PharmD

## 2013-06-30 NOTE — Progress Notes (Signed)
ANTICOAGULATION CONSULT NOTE - Follow Up Consult  Pharmacy Consult for coumadin and lovenox Indication: DVT  Allergies  Allergen Reactions  . Penicillins     *childhood allergy*  . Sulfa Antibiotics Hives    Patient Measurements: Height: 5\' 5"  (165.1 cm) Weight: 140 lb (63.504 kg) IBW/kg (Calculated) : 57   Vital Signs: Temp: 98.2 F (36.8 C) (01/05 0405) Temp src: Oral (01/05 0405) BP: 132/69 mmHg (01/05 0405) Pulse Rate: 80 (01/05 0405)  Labs:  Recent Labs  06/27/13 1900 06/28/13 0525 06/29/13 0620 06/30/13 0405  HGB 11.1* 10.3* 9.2* 9.1*  HCT 33.4* 30.9* 27.3* 27.4*  PLT 164 162 159 209  LABPROT  --   --  15.2 15.8*  INR  --   --  1.23 1.29  CREATININE 1.06 1.05 1.02  --     Estimated Creatinine Clearance: 48.2 ml/min (by C-G formula based on Cr of 1.02).  Assessment: Patient is a 68 y.o F on anticoagulation overlap day #3 of 5 days minimum for new DVT.  Note indicates that may change anticoagulation to xarelto if able to get insurance approval.  INR is subtherapeutic at 1.29 today.  Hgb down slightly to 9.1, plt stable. No bleeding documented.  Goal of Therapy:  INR 2-3 Monitor platelets by anticoagulation protocol: Yes   Plan:  1. Continue Lovenox 60mg  q12h 2. Coumadin 5mg  po x 1 today 3.  F/u plan for anticoagulation  Sherrilynn Gudgel P 06/30/2013,2:32 PM

## 2013-06-30 NOTE — Care Management Note (Signed)
CARE MANAGEMENT NOTE 06/30/2013  Patient:  Adelfa KohBRIGHTWELL,Tara P   Account Number:  [id number]  Date Initiated:  06/30/2013  Documentation initiated by:  Vance PeperBRADY,Carrol Hougland  Subjective/Objective Assessment:   [age]yr old female s/p left femur IM Nailing.     Action/Plan:   CM spoke with patient and with her daughter Annice PihJackie (via phone). Discussed need for home health and equipment.  Choice offered. Dtg states patient has husband at home but he has medical issues.   Anticipated DC Date:  06/30/2013   Anticipated DC Plan:  HOME W HOME HEALTH SERVICES      DC Planning Services  CM consult      PAC Choice  DURABLE MEDICAL EQUIPMENT  HOME HEALTH   Choice offered to / List presented to:  C-1 Patient   DME arranged  3-N-1  Levan HurstWALKER - ROLLING      DME agency  [facility]     HH arranged  HH-2 PT  HH-3 OT  HH-4 NURSE'S AIDE  HH-6 SOCIAL WORKER      HH agency  [facility]   Status of service:  Completed, signed off

## 2013-06-30 NOTE — Progress Notes (Signed)
Patient ID: Maureen Ford  female  YNW:[id number]    DOB: Feb 15, 1946    DOA: 06/26/2013  PCP: Josue Hector, MD  Assessment/Plan:  Active Problems:  Right lower extremity DVT  - Continue therapeutic Lovenox, orthopedics okayed starting therapeutic Lovenox on 1/2  - day 3 of coumadin today, INR still subtherapeutic at 1.29. Discussed with Dr. Dion Saucier, who is okay for xarelto from orthopedic standpoint. Place the case manager consult for insurance approval for xarelto. If approved, will start xarelto tonight.  Fracture, femur, distal  -s/p surgery- intramedullary femoral nailing on 1/2 per Dr. Dion Saucier  -Management per orthopedics   COPD (chronic obstructive pulmonary disease)  -stable, continue when necessary bronchodilators  -No wheezing preoperatively   Hypertension  -on nevibilol, follow   Hypothyroidism  -Continue Synthroid   Chronic steroid use  -Patient remaining hemodynamically stable  -Taper IV hydrocortisone, start maintenance prednisone from tomorrow a.m.    Escherichia coli UTI - Continue ciprofloxacin  DVT prophylaxis Lovenox  Code Status: Full   Family Communication: none at bedside  Disposition : Hopefully tomorrow, patient currently adamant about going home  Consultants:  Orthopedics  Procedures:  Left intramedullary retrograde femoral nailing 06/27/12  2-D echocardiogram pending  Impressions:  - Normal LV size with vigorous systolic function, EF 65-70%. Normal RV size and systolic function. No significant valvular abnormalities.  Lower extremity venous duplex  - Findings consistent with acute deep vein thrombosis involving tyhe popliteal, femoral and common femoral veins of the the right lower extremity. Flow is minimal. There is a superficial thrombus of the saphenofemoral junction. - There is no obvious evidence of propagation to the left side at this time   Subjective: Denies any specific complaints, denies any pain, chest pain,  shortness of breath, nausea vomiting  Objective: Weight change:   Intake/Output Summary (Last 24 hours) at 06/30/13 1255 Last data filed at 06/30/13 0830  Gross per 24 hour  Intake    720 ml  Output      0 ml  Net    720 ml   Blood pressure 132/69, pulse 80, temperature 98.2 F (36.8 C), temperature source Oral, resp. rate 18, height 5\' 5"  (1.651 m), weight 63.504 kg (140 lb), SpO2 98.00%.  Physical Exam: General: Alert and awake, oriented x3, not in any acute distress. CVS: S1-S2 clear, no murmur rubs or gallops Chest: clear to auscultation bilaterally Abdomen: soft nontender, nondistended, normal bowel sounds  Extremities: Left lower extremity in brace Lab Results: Basic Metabolic Panel:  Recent Labs Lab 06/28/13 0525 06/29/13 0620  NA 138 142  K 4.5 4.1  CL 102 109  CO2 22 21  GLUCOSE 111* 108*  BUN 17 18  CREATININE 1.05 1.02  CALCIUM 8.4 8.5   Liver Function Tests: No results found for this basename: AST, ALT, ALKPHOS, BILITOT, PROT, ALBUMIN,  in the last 168 hours No results found for this basename: LIPASE, AMYLASE,  in the last 168 hours No results found for this basename: AMMONIA,  in the last 168 hours CBC:  Recent Labs Lab 06/26/13 2346  06/29/13 0620 06/30/13 0405  WBC 9.7  < > 10.1 10.4  NEUTROABS 8.1*  --   --   --   HGB 13.2  < > 9.2* 9.1*  HCT 38.6  < > 27.3* 27.4*  MCV 102.9*  < > 105.4* 106.2*  PLT 183  < > 159 209  < > = values in this interval not displayed. Cardiac Enzymes: No results found for this basename: CKTOTAL,  CKMB, CKMBINDEX, TROPONINI,  in the last 168 hours BNP: No components found with this basename: POCBNP,  CBG: No results found for this basename: GLUCAP,  in the last 168 hours   Micro Results: Recent Results (from the past 240 hour(s))  SURGICAL PCR SCREEN     Status: None   Collection Time    06/27/13  6:34 AM      Result Value Range Status   MRSA, PCR NEGATIVE  NEGATIVE Final   Staphylococcus aureus NEGATIVE   NEGATIVE Final   Comment:            The Xpert SA Assay (FDA     approved for NASAL specimens     in patients over 68 years of age),     is one component of     a comprehensive surveillance     program.  Test performance has     been validated by [facility] for patients greater     than or equal to 68 year old.     It is not intended     to diagnose infection nor to     guide or monitor treatment.  URINE CULTURE     Status: None   Collection Time    06/28/13  5:50 AM      Result Value Range Status   Specimen Description URINE, CLEAN CATCH   Final   Special Requests CX ADDED AT 0622 ON [id number]   Final   Culture  Setup Time     Final   Value: 06/28/2013 06:38     Performed at [facility]   Colony Count     Final   Value: >=100,000 COLONIES/ML     Performed at [facility]   Culture     Final   Value: ESCHERICHIA COLI     Performed at [facility]   Report Status 06/30/2013 FINAL   Final   Organism ID, Bacteria ESCHERICHIA COLI   Final    Studies/Results: Dg Femur Left  06/27/2013   CLINICAL DATA:  IM retrograde nail of the left femur  EXAM: DG C-ARM 61-120 MIN; LEFT FEMUR - 2 VIEW  COMPARISON:  Left femur radiographs - 06/26/2013  FLUOROSCOPY TIME:  dictate in minutes & seconds  FINDINGS: Five spot intraoperative fluoroscopic images of the left femur are provided for review.  Images demonstrate the sequela of intra medullary rod fixation of previously identified oblique, comminuted distal diaphyseal femur fracture. The proximal end of the intra medullary rod is transfixed with a cancellous screw. The distal end of the rod is transfixed with 4 cancellous screws.  Alignment appears near anatomic.  There is expected subcutaneous emphysema about the operative site. No definite radiopaque foreign body.  IMPRESSION: Post intra medullary rod fixation of comminuted distal femur fracture without evidence of complication.   Electronically Signed   By: Simonne ComeJohn  Watts  M.D.   On: 06/27/2013 12:38   Dg Chest Portable 1 View  06/27/2013   CLINICAL DATA:  Fall with femur fracture.  EXAM: PORTABLE CHEST - 1 VIEW  COMPARISON:  05/04/2005  FINDINGS: Normal heart size and pulmonary vascularity. There appears to be infiltration or atelectasis in the left lung base obscuring the left hemidiaphragm. Pneumonia may be present. Pleural thickening in the left apex. 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  IMPRESSION: Focal infiltration in the left lung base may represent pneumonia.   Electronically Signed   By: Burman NievesWilliam  Stevens  M.D.   On: 06/27/2013 00:46   Dg Hip Portable 1 View Left  06/27/2013   CLINICAL DATA:  Fall.  Distal femur fracture.  EXAM: PORTABLE LEFT HIP - 1 VIEW  COMPARISON:  None.  FINDINGS: No evidence of acute fracture or dislocation of the left hip on single view.  IMPRESSION: Negative.   Electronically Signed   By: Burman Nieves M.D.   On: 06/27/2013 00:47   Dg Femur Left Port  06/27/2013   CLINICAL DATA:  Postop left femur fracture.  EXAM: PORTABLE LEFT FEMUR - 2 VIEW  COMPARISON:  Intraoperative exam 06/27/2013. Preoperative exam 06/26/2013.  FINDINGS: Left femoral intra medullary rod with proximal and distal fixation screws placed for treatment of comminuted distal left femoral fracture. There is better alignment of fracture fragments although still slightly separated.  IMPRESSION: Open reduction and internal fixation of distal left femur fracture. Please see above.   Electronically Signed   By: Bridgett Larsson M.D.   On: 06/27/2013 13:41   Dg Knee Complete 4 Views Left  06/26/2013   CLINICAL DATA:  History of trauma from a fall.  Leg pain.  EXAM: LEFT KNEE - COMPLETE 4+ VIEW  COMPARISON:  No priors.  FINDINGS: Multiple views of the left knee demonstrate a highly comminuted fracture of the distal femoral metadiaphysis. There is approximately 2 cm of lateral displacement and approximately 15 degrees of dorsal angulation. The knee joint itself  appears intact. Overlying soft tissues appear swollen.  IMPRESSION: 1. Severely comminuted displaced and mildly angulated fracture of the distal femoral metadiaphysis, as above.   Electronically Signed   By: Trudie Reed M.D.   On: 06/26/2013 23:51   Dg C-arm 61-120 Min  06/27/2013   CLINICAL DATA:  IM retrograde nail of the left femur  EXAM: DG C-ARM 61-120 MIN; LEFT FEMUR - 2 VIEW  COMPARISON:  Left femur radiographs - 06/26/2013  FLUOROSCOPY TIME:  dictate in minutes & seconds  FINDINGS: Five spot intraoperative fluoroscopic images of the left femur are provided for review.  Images demonstrate the sequela of intra medullary rod fixation of previously identified oblique, comminuted distal diaphyseal femur fracture. The proximal end of the intra medullary rod is transfixed with a cancellous screw. The distal end of the rod is transfixed with 4 cancellous screws.  Alignment appears near anatomic.  There is expected subcutaneous emphysema about the operative site. No definite radiopaque foreign body.  IMPRESSION: Post intra medullary rod fixation of comminuted distal femur fracture without evidence of complication.   Electronically Signed   By: Simonne Come M.D.   On: 06/27/2013 12:38    Medications: Scheduled Meds: . albuterol  1-2 puff Inhalation TID  . ciprofloxacin  250 mg Oral BID  . coumadin book   Does not apply Once  . docusate sodium  100 mg Oral BID  . enoxaparin (LOVENOX) injection  60 mg Subcutaneous Q12H  . hydrocortisone sod succinate (SOLU-CORTEF) inj  50 mg Intravenous Q8H  . levothyroxine  25 mcg Oral QAC breakfast  . nebivolol  2.5 mg Oral Daily  . ondansetron (ZOFRAN) IV  4 mg Intravenous Once  . pantoprazole  40 mg Oral Q0600  . pneumococcal 23 valent vaccine  0.5 mL Intramuscular Tomorrow-1000  . senna  1 tablet Oral BID  . sertraline  50 mg Oral Daily  . tiotropium  18 mcg Inhalation Daily  . warfarin   Does not apply Once  . Warfarin - Pharmacist Dosing Inpatient   Does  not apply  q1800      LOS: 4 days   Labrian Torregrossa M.D. [facility] 06/30/2013, 12:55 PM Pager: [phone]  If 7PM-7AM, please contact night-coverage [email or web address] Password [email or web address]

## 2013-06-30 NOTE — Progress Notes (Signed)
Physical Therapy Treatment Patient Details Name: Maureen Ford MRN: [id number] DOB: 05/20/1946 Today's Date: 06/30/2013 Time: 8295-62131116-1213 PT Time Calculation (min): 57 min  PT Assessment / Plan / Recommendation  History of Present Illness Pt fell at home sustaining L distal femur fx.  Pt underwent IM nailing yesterday, 06-27-12.   PT Comments   Continuing progress with mobility, amb, and steps; pt wants to go home, but children want her to go to [facility]. I am recommending HHPT either way.    Follow Up Recommendations  Home health PT     Does the patient have the potential to tolerate intense rehabilitation     Barriers to Discharge        Equipment Recommendations  Rolling walker with 5" wheels;3in1 (PT);Wheelchair (measurements PT)    Recommendations for Other Services    Frequency Min 5X/week   Progress towards PT Goals Progress towards PT goals: Progressing toward goals  Plan Current plan remains appropriate    Precautions / Restrictions Precautions Precautions: Fall Required Braces or Orthoses: Knee Immobilizer - Left Knee Immobilizer - Left: On at all times Restrictions LLE Weight Bearing: Touchdown weight bearing   Pertinent Vitals/Pain Minimal pain, but quite tired after session; patient repositioned for comfort in bed    Mobility  Bed Mobility Bed Mobility: Sit to Supine Sit to Supine: 4: Min guard Details for Bed Mobility Assistance: Cues for technique Transfers Transfers: Sit to Stand;Stand to Sit Sit to Stand: 5: Supervision;From chair/3-in-1;With upper extremity assist Stand to Sit: 5: Supervision;To chair/3-in-1 Details for Transfer Assistance: cues for hand placement & LLE positioning; smooth transitions Ambulation/Gait Ambulation/Gait Assistance: 4: Min guard;5: Supervision Ambulation Distance (Feet): 75 Feet Assistive device: Rolling walker Ambulation/Gait Assistance Details: Cues for sequence and TWB; good maintenance of WB precautions Gait  Pattern: Step-through pattern Gait velocity: decreased Stairs: Yes Stairs Assistance: 3: Mod assist  Stairs Assistance Details (indicate cue type and reason): Lengthy discussion of how to manage steps at home; The usual way of going up backwards with RW won't work because Maureen Ford's step s to get in her house do not have backs to brace RW wheels on; We discussed "bumping" up the steps, which I hesitate to recommend as it's very hard to go from sitting on the floor to standing; the pt herself declined using a crutch and rail; we ended up practicing with one hand on rail and the other around a helper's shoulders (here, it was this PT; at home it will be her son); Cues for sequence an technique Stair Management Technique: One rail Right;Step to pattern;Forwards;Other (comment) (with arm around helper) Number of Stairs: 3    Exercises     PT Diagnosis:    PT Problem List:   PT Treatment Interventions:     PT Goals (current goals can now be found in the care plan section) Acute Rehab PT Goals Patient Stated Goal: home PT Goal Formulation: With patient Time For Goal Achievement: 07/05/13 Potential to Achieve Goals: Good  Visit Information  Last PT Received On: 06/30/13 Assistance Needed: +1 History of Present Illness: Pt fell at home sustaining L distal femur fx.  Pt underwent IM nailing yesterday, 06-27-12.    Subjective Data  Subjective: REALLY wanting to dc home; Seems less interested in SNF for rehab Patient Stated Goal: home   Cognition  Cognition Arousal/Alertness: Awake/alert Behavior During Therapy: WFL for tasks assessed/performed Overall Cognitive Status: Within Functional Limits for tasks assessed    Balance     End of Session  PT - End of Session Equipment Utilized During Treatment: Left knee immobilizer Activity Tolerance: Patient tolerated treatment well Patient left: in bed;with call bell/phone within reach Nurse Communication: Mobility status   GP      Van Clines [facility] [place], [place] [phone]  06/30/2013, 1:35 PM

## 2013-07-01 MED ORDER — CIPROFLOXACIN HCL 250 MG PO TABS: 250.0000 mg | ORAL_TABLET | Freq: Two times a day (BID) | ORAL | Status: AC

## 2013-07-01 MED ORDER — ALBUTEROL SULFATE HFA 108 (90 BASE) MCG/ACT IN AERS: 1.0000 | INHALATION_SPRAY | Freq: Four times a day (QID) | RESPIRATORY_TRACT | Status: AC | PRN

## 2013-07-01 MED ORDER — POLYETHYLENE GLYCOL 3350 17 G PO PACK: 17.0000 g | PACK | Freq: Every day | ORAL | Status: AC | PRN

## 2013-07-01 MED ORDER — RIVAROXABAN 15 MG PO TABS: 15.0000 mg | ORAL_TABLET | Freq: Two times a day (BID) | ORAL | Status: AC

## 2013-07-01 MED ORDER — RIVAROXABAN 20 MG PO TABS: 20.0000 mg | ORAL_TABLET | Freq: Every day | ORAL | Status: AC

## 2013-07-01 MED ORDER — DSS 100 MG PO CAPS: 100.0000 mg | ORAL_CAPSULE | Freq: Two times a day (BID) | ORAL | Status: DC

## 2013-07-01 NOTE — Progress Notes (Signed)
Occupational Therapy Treatment Patient Details Name: Maureen Ford MRN: [id number] DOB: Jul 18, 1945 Today's Date: 07/01/2013 Time: [id number] OT Time Calculation (min): 34 min  OT Assessment / Plan / Recommendation  History of present illness Pt fell at home sustaining L distal femur fx.  Pt underwent IM nailing yesterday, 06-27-12.   OT comments  This 68 yo female continues to make progress. SNF rehab will get her to a Mod I level.  Follow Up Recommendations  SNF       Equipment Recommendations  3 in 1 bedside comode;Wheelchair cushion (measurements OT);Wheelchair (measurements OT)       Frequency Min 2X/week   Progress towards OT Goals Progress towards OT goals: Progressing toward goals  Plan Discharge plan needs to be updated    Precautions / Restrictions Precautions Precautions: Fall Required Braces or Orthoses: Knee Immobilizer - Left Knee Immobilizer - Left: On at all times Restrictions Weight Bearing Restrictions: Yes LLE Weight Bearing: Touchdown weight bearing       ADL  Toilet Transfer: Supervision/safety Toilet Transfer Method: Sit to Baristastand Toilet Transfer Equipment: Comfort height toilet;Grab bars Toileting - ArchitectClothing Manipulation and Hygiene: Supervision/safety Where Assessed - Toileting Clothing Manipulation and Hygiene: Sit on 3-in-1 or toilet;Sit to stand from 3-in-1 or toilet Equipment Used: Knee Immobilizer;Rolling walker Transfers/Ambulation Related to ADLs: S for all with RW and pt maintaining TDWB ADL Comments: Pt able at a S level to go to closet get her clothes out, drape them over the RW, and take them to where she would put them on.      OT Goals(current goals can now be found in the care plan section)    Visit Information  Last OT Received On: 07/01/13 Assistance Needed: +1 History of Present Illness: Pt fell at home sustaining L distal femur fx.  Pt underwent IM nailing yesterday, 06-27-12.          Cognition   Cognition Arousal/Alertness: Awake/alert Behavior During Therapy: WFL for tasks assessed/performed Overall Cognitive Status: Within Functional Limits for tasks assessed    Mobility  Bed Mobility Bed Mobility: Supine to Sit;Sitting - Scoot to Edge of Bed Supine to Sit: 6: Modified independent (Device/Increase time);HOB elevated Sitting - Scoot to Edge of Bed: 6: Modified independent (Device/Increase time) Transfers Transfers: Sit to Stand;Stand to Sit Sit to Stand: 5: Supervision;With upper extremity assist;From bed Stand to Sit: 5: Supervision;To chair/3-in-1;With upper extremity assist;With armrests Details for Transfer Assistance: Safe hand placement          End of Session OT - End of Session Equipment Utilized During Treatment: Rolling walker;Left knee immobilizer Activity Tolerance: Patient tolerated treatment well Patient left: in chair;with call bell/phone within reach    Evette GeorgesLeonard, Darcia Lampi Eva [phone] 07/01/2013, 11:50 AM

## 2013-07-01 NOTE — Discharge Summary (Addendum)
Physician Discharge Summary  Patient ID: LOREL LEMBO MRN: [id number] DOB/AGE: 07-30-1945 68 y.o.  Admit date: 06/26/2013 Discharge date: 07/01/2013  Primary Care Physician:  Josue Hector, MD  Discharge Diagnoses:   Left supracondylar Distal femur fracture status post intramedullary femoral nailing . Hypertension . Hypothyroidism . Mood disorder . COPD (chronic obstructive pulmonary disease) . UTI (urinary tract infection) Acute DVT right lower extremity  Consults:  Orthopedics, Dr.Landau   Recommendations for Outpatient Follow-up:  Patient is started on xarelto 15mg  BID x 21 days, then 20mg  daily with supper for 3 months, starting on 07/22/2013  L distal femur fx s/p IMN  NWB, Dressing change as needed  Toe and ankle ROM as tolerated  Knee immobilizer on when working with therapy  Knee ROM as tolerated, HEP   Allergies:   Allergies  Allergen Reactions  . Penicillins     *childhood allergy*  . Sulfa Antibiotics Hives     Discharge Medications:   Medication List         albuterol 108 (90 BASE) MCG/ACT inhaler  Commonly known as:  PROVENTIL HFA;VENTOLIN HFA  Inhale 1 puff into the lungs every 6 (six) hours as needed for wheezing or shortness of breath.     ciprofloxacin 250 MG tablet  Commonly known as:  CIPRO  Take 1 tablet (250 mg total) by mouth 2 (two) times daily. X 1 more day     HYDROcodone-acetaminophen 10-325 MG per tablet  Commonly known as:  NORCO  Take 1-2 tablets by mouth every 6 (six) hours as needed.     levothyroxine 25 MCG tablet  Commonly known as:  SYNTHROID, LEVOTHROID  Take 25 mcg by mouth daily before breakfast.     nebivolol 5 MG tablet  Commonly known as:  BYSTOLIC  Take 2.5 mg by mouth daily.     polyethylene glycol packet  Commonly known as:  MIRALAX / GLYCOLAX  Take 17 g by mouth daily as needed for mild constipation.     predniSONE 5 MG tablet  Commonly known as:  DELTASONE  Take 5 mg by mouth daily with  breakfast.     Rivaroxaban 15 MG Tabs tablet  Commonly known as:  XARELTO  Take 1 tablet (15 mg total) by mouth 2 (two) times daily with a meal. X 21 days, till 07/21/12     Rivaroxaban 20 MG Tabs tablet  Commonly known as:  XARELTO  Take 1 tablet (20 mg total) by mouth daily with supper.  Start taking on:  07/22/2013     sennosides-docusate sodium 8.6-50 MG tablet  Commonly known as:  SENOKOT-S  Take 2 tablets by mouth daily.     sertraline 50 MG tablet  Commonly known as:  ZOLOFT  Take 50 mg by mouth daily.     tiotropium 18 MCG inhalation capsule  Commonly known as:  SPIRIVA  Place 18 mcg into inhaler and inhale daily.         Brief H and P: For complete details please refer to admission H and P, but in briefMarianne P Sherrill is a 68 y.o. female with Past medical history of hypertension, thyroid disease, COPD, chronic steroid due to arthritis.  The patient presented with a fall. She mentioned that she was cleaning her kitchen and was trying to walk when she stepped over her broom and lost balance and hit the ground. She denied any focal neurological deficit after the fall no head injury no neck injury.   Hospital Course:  Fracture, femur,  distal : Secondary to mechanical fall. Orthopedics was consulted and patient underwent intramedullary femoral nailing procedure on 06/27/13. Patient was closely followed by orthopedic service, please see instructions above.  Right lower extremity DVT : Patient was initially started on Lovenox and Coumadin. Discussed with orthopedics, Dr. Dion Saucier was okay xarelto from orthopedic standpoint. Insurance approval was obtained for xarelto and patient was started on xarelto on 06/30/12 evening. She will continue her xarelto 15 mg BID x 21 days, then after 20 mg daily for at least 3-6 months.    COPD (chronic obstructive pulmonary disease)  -stable, continue when necessary bronchodilators  -No wheezing preoperatively   Hypertension  -on nevibilol,  follow   Hypothyroidism  -Continue Synthroid   Chronic steroid use  -Patient remained hemodynamically stable . She was started on IV hydrocortisone which has been tapered and patient is currently on oral maintenance dose.  Escherichia coli UTI  -Patient was placed on ciprofloxacin, will need another one day   to complete the course   Day of Discharge BP 125/60  Pulse 76  Temp(Src) 98.9 F (37.2 C) (Oral)  Resp 16  Ht 5\' 5"  (1.651 m)  Wt 63.504 kg (140 lb)  BMI 23.30 kg/m2  SpO2 95%  Physical Exam: General: Alert and awake oriented x3 not in any acute distress. HEENT: anicteric sclera, pupils reactive to light and accommodation CVS: S1-S2 clear no murmur rubs or gallops Chest: clear to auscultation bilaterally, no wheezing rales or rhonchi Abdomen: soft nontender, nondistended, normal bowel sounds Extremities: no cyanosis, clubbing or edema noted bilaterally Neuro: Cranial nerves II-XII intact, no focal neurological deficits   The results of significant diagnostics from this hospitalization (including imaging, microbiology, ancillary and laboratory) are listed below for reference.    LAB RESULTS: Basic Metabolic Panel:  Recent Labs Lab 06/28/13 0525 06/29/13 0620  NA 138 142  K 4.5 4.1  CL 102 109  CO2 22 21  GLUCOSE 111* 108*  BUN 17 18  CREATININE 1.05 1.02  CALCIUM 8.4 8.5   Liver Function Tests: No results found for this basename: AST, ALT, ALKPHOS, BILITOT, PROT, ALBUMIN,  in the last 168 hours No results found for this basename: LIPASE, AMYLASE,  in the last 168 hours No results found for this basename: AMMONIA,  in the last 168 hours CBC:  Recent Labs Lab 06/26/13 2346  06/29/13 0620 06/30/13 0405  WBC 9.7  < > 10.1 10.4  NEUTROABS 8.1*  --   --   --   HGB 13.2  < > 9.2* 9.1*  HCT 38.6  < > 27.3* 27.4*  MCV 102.9*  < > 105.4* 106.2*  PLT 183  < > 159 209  < > = values in this interval not displayed. Cardiac Enzymes: No results found for this  basename: CKTOTAL, CKMB, CKMBINDEX, TROPONINI,  in the last 168 hours BNP: No components found with this basename: POCBNP,  CBG: No results found for this basename: GLUCAP,  in the last 168 hours  Significant Diagnostic Studies:  Dg Femur Left  06/27/2013   CLINICAL DATA:  IM retrograde nail of the left femur  EXAM: DG C-ARM 61-120 MIN; LEFT FEMUR - 2 VIEW  COMPARISON:  Left femur radiographs - 06/26/2013  FLUOROSCOPY TIME:  dictate in minutes & seconds  FINDINGS: Five spot intraoperative fluoroscopic images of the left femur are provided for review.  Images demonstrate the sequela of intra medullary rod fixation of previously identified oblique, comminuted distal diaphyseal femur fracture. The proximal end of the  intra medullary rod is transfixed with a cancellous screw. The distal end of the rod is transfixed with 4 cancellous screws.  Alignment appears near anatomic.  There is expected subcutaneous emphysema about the operative site. No definite radiopaque foreign body.  IMPRESSION: Post intra medullary rod fixation of comminuted distal femur fracture without evidence of complication.   Electronically Signed   By: Simonne ComeJohn  Watts M.D.   On: 06/27/2013 12:38   Dg Chest Portable 1 View  06/27/2013   CLINICAL DATA:  Fall with femur fracture.  EXAM: PORTABLE CHEST - 1 VIEW  COMPARISON:  05/04/2005  FINDINGS: Normal heart size and pulmonary vascularity. There appears to be infiltration or atelectasis in the left lung base obscuring the left hemidiaphragm. Pneumonia may be present. Pleural thickening in the left apex. 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  IMPRESSION: Focal infiltration in the left lung base may represent pneumonia.   Electronically Signed   By: Burman NievesWilliam  Stevens M.D.   On: 06/27/2013 00:46   Dg Hip Portable 1 View Left  06/27/2013   CLINICAL DATA:  Fall.  Distal femur fracture.  EXAM: PORTABLE LEFT HIP - 1 VIEW  COMPARISON:  None.  FINDINGS: No evidence of acute fracture or  dislocation of the left hip on single view.  IMPRESSION: Negative.   Electronically Signed   By: Burman NievesWilliam  Stevens M.D.   On: 06/27/2013 00:47   Dg Femur Left Port  06/27/2013   CLINICAL DATA:  Postop left femur fracture.  EXAM: PORTABLE LEFT FEMUR - 2 VIEW  COMPARISON:  Intraoperative exam 06/27/2013. Preoperative exam 06/26/2013.  FINDINGS: Left femoral intra medullary rod with proximal and distal fixation screws placed for treatment of comminuted distal left femoral fracture. There is better alignment of fracture fragments although still slightly separated.  IMPRESSION: Open reduction and internal fixation of distal left femur fracture. Please see above.   Electronically Signed   By: Bridgett LarssonSteve  Olson M.D.   On: 06/27/2013 13:41   Dg Knee Complete 4 Views Left  06/26/2013   CLINICAL DATA:  History of trauma from a fall.  Leg pain.  EXAM: LEFT KNEE - COMPLETE 4+ VIEW  COMPARISON:  No priors.  FINDINGS: Multiple views of the left knee demonstrate a highly comminuted fracture of the distal femoral metadiaphysis. There is approximately 2 cm of lateral displacement and approximately 15 degrees of dorsal angulation. The knee joint itself appears intact. Overlying soft tissues appear swollen.  IMPRESSION: 1. Severely comminuted displaced and mildly angulated fracture of the distal femoral metadiaphysis, as above.   Electronically Signed   By: Trudie Reedaniel  Entrikin M.D.   On: 06/26/2013 23:51   Dg C-arm 61-120 Min  06/27/2013   CLINICAL DATA:  IM retrograde nail of the left femur  EXAM: DG C-ARM 61-120 MIN; LEFT FEMUR - 2 VIEW  COMPARISON:  Left femur radiographs - 06/26/2013  FLUOROSCOPY TIME:  dictate in minutes & seconds  FINDINGS: Five spot intraoperative fluoroscopic images of the left femur are provided for review.  Images demonstrate the sequela of intra medullary rod fixation of previously identified oblique, comminuted distal diaphyseal femur fracture. The proximal end of the intra medullary rod is transfixed with a  cancellous screw. The distal end of the rod is transfixed with 4 cancellous screws.  Alignment appears near anatomic.  There is expected subcutaneous emphysema about the operative site. No definite radiopaque foreign body.  IMPRESSION: Post intra medullary rod fixation of comminuted distal femur fracture without evidence of complication.   Electronically  Signed   By: Simonne Come M.D.   On: 06/27/2013 12:38   Doppler ultrasound of the lower extremity 1-2- 2015 Summary:  - Findings consistent with acute deep vein thrombosis involving tyhe popliteal, femoral and common femoral veins of the the right lower extremity. Flow is minimal. There is a superficial thrombus of the saphenofemoral junction. - There is no obvious evidence of propagation to the left side at this time  2-D echo Study Conclusions  - Left ventricle: The cavity size was normal. Wall thickness was normal. Systolic function was vigorous. The estimated ejection fraction was in the range of 65% to 70%. Wall motion was normal; there were no regional wall motion abnormalities. Doppler parameters are consistent with abnormal left ventricular relaxation (grade 1 diastolic dysfunction). - Aortic valve: There was no stenosis. - Mitral valve: Trivial regurgitation. - Left atrium: The atrium was moderately dilated. - Right ventricle: The cavity size was normal. Systolic function was normal. - Tricuspid valve: Peak RV-RA gradient: 35mm Hg (S). - Pulmonary arteries: PA peak pressure: 40mm Hg (S). - Inferior vena cava: The vessel was normal in size; the respirophasic diameter changes were in the normal range (= 50%); findings are consistent with normal central venous pressure.     Disposition and Follow-up: Discharge Orders   Future Orders Complete By Expires   Diet - low sodium heart healthy  As directed    Discharge instructions  As directed    Comments:     Please continue Xarelto 15mg  twice/day for 21 days, then 20mg   daily with supper for total of 3 months. Your Primary doctor may wish to check ultrasound to confirm that the DVT/clot has resolved.  Please avoid cuts or falls. If you have any frank GI or nose bleed, please return to ER immediately.   Increase activity slowly  As directed    Touch down weight bearing  As directed    Questions:     Laterality:     Extremity:         DISPOSITION:Rehabilitation  Diet   heart healthy   DISCHARGE FOLLOW-UP Follow-up Information   Follow up with Eulas Post, MD. Schedule an appointment as soon as possible for a visit in 2 weeks.   Specialty:  Orthopedic Surgery   Contact information:   [address] [place] [address] [phone]       Follow up with Josue Hector, MD. Schedule an appointment as soon as possible for a visit in 2 weeks. (for hospital follow-up)    Specialty:  Family Medicine   Contact information:   [address] [place] [address] [phone]       Time spent on Discharge:  40 minutes  Signed:   Analise Glotfelty M.D. [facility] 07/01/2013, 10:31 AM Pager: [phone]

## 2013-07-01 NOTE — Progress Notes (Signed)
     Subjective:  Patient reports pain as mild.  No new complaints. No SOB.  Objective:   VITALS:   Filed Vitals:   06/30/13 0405 06/30/13 1400 06/30/13 2033 07/01/13 0600  BP: 132/69 112/55 110/54 125/60  Pulse: 80 79 82 76  Temp: 98.2 F (36.8 C) 98.4 F (36.9 C) 99.1 F (37.3 C) 98.9 F (37.2 C)  TempSrc: Oral     Resp: 18 20 18 16   Height:      Weight:      SpO2: 98% 94% 95% 95%    Neurologically intact Incision: dressing C/D/I   Lab Results  Component Value Date   WBC 10.4 06/30/2013   HGB 9.1* 06/30/2013   HCT 27.4* 06/30/2013   MCV 106.2* 06/30/2013   PLT 209 06/30/2013     Assessment/Plan: 4 Days Post-Op   Active Problems:   Fracture, femur, distal   Chronic steroid use   Hypertension   Hypothyroidism   Mood disorder   COPD (chronic obstructive pulmonary disease)   UTI (urinary tract infection)   Advance diet Up with therapy Discharge home with home health [facility], rtc 2 weeks.  xarelto for dvt treatment.   Darrow Barreiro P 07/01/2013, 9:54 AM   Teryl LucyJoshua Milani Lowenstein, MD Cell [phone]

## 2013-07-01 NOTE — Progress Notes (Signed)
CSW (Clinical Child psychotherapistocial Worker) spoke with pt who asked CSW to please call her daughter. CSW confirmed with pt daughter that first choice is for [facility]. Pt has a bed available to dc there today.   Lilu Mcglown, LCSWA [phone]

## 2013-07-01 NOTE — Progress Notes (Signed)
CSW (Clinical Social Worker) prepared pt dc packet and placed with shadow chart. CSW arranged non-emergent ambulance transport for 3:30pm. Pt, pt family, pt nurse, and facility informed. CSW signing off.  Karaline Buresh, LCSWA [phone]  

## 2013-07-01 NOTE — Progress Notes (Addendum)
CSW (Clinical Child psychotherapistocial Worker) made aware by RN CM that pt dc plan will be home with HH. At this time, no further CSW needs. CSW signing off.  ADDENDUM: CSW received call from OT informing that pt is now agreeable to SNF and [facility] [address] [facility] [address] [facility]  Verneal Wiers, LCSWA [phone]

## 2013-07-01 NOTE — Progress Notes (Signed)
[facility] Pt IV was removed, VS stable for discharge. Waiting on EMS for transportation to facility.

## 2013-07-01 NOTE — Progress Notes (Signed)
Reviewed Ms. Maureen Ford's progress, and agree with amendment to DC plan as outlined by Althea CharonKelly, PTA  Yassmin Binegar, PT [phone]

## 2013-07-01 NOTE — Progress Notes (Signed)
Physical Therapy Treatment Patient Details Name: Maureen Ford MRN: [id number] DOB: 12/30/1945 Today's Date: 07/01/2013 Time: [id number] PT Time Calculation (min): 19 min  PT Assessment / Plan / Recommendation  History of Present Illness Pt fell at home sustaining L distal femur fx.  Pt underwent IM nailing yesterday, 06-27-12.   PT Comments   Pt cont's to make progress with mobility.  Per chart review pt has been contemplating between Home vs SNF.  Pt states decision is now for SNF prior to returning home.     Follow Up Recommendations  SNF.      Does the patient have the potential to tolerate intense rehabilitation     Barriers to Discharge        Equipment Recommendations  Rolling walker with 5" wheels;3in1 (PT);Wheelchair (measurements PT)    Recommendations for Other Services    Frequency Min 5X/week   Progress towards PT Goals Progress towards PT goals: Progressing toward goals  Plan Current plan remains appropriate    Precautions / Restrictions Precautions Precautions: Fall Required Braces or Orthoses: Knee Immobilizer - Left Knee Immobilizer - Left: On at all times Restrictions Weight Bearing Restrictions: Yes LLE Weight Bearing: Touchdown weight bearing   Pertinent Vitals/Pain No pain reported.      Mobility   Sitting - Scoot to Edge of Bed: 6: Modified independent (Device/Increase time) Transfers Transfers: Sit to Stand;Stand to Sit Sit to Stand: 6: Modified independent (Device/Increase time);With upper extremity assist;With armrests;From chair/3-in-1 Stand to Sit: 6: Modified independent (Device/Increase time);With upper extremity assist;With armrests;To chair/3-in-1 Details for Transfer Assistance: Safe hand placement Ambulation/Gait Ambulation/Gait Assistance: 5: Supervision Ambulation Distance (Feet): 75 Feet Assistive device: Rolling walker Ambulation/Gait Assistance Details: Cues to reinforce TWB with pt demonstrating good maintenance of  precautions.  Encouragement to increase distance.  Gait Pattern: Step-through pattern Gait velocity: decreased Stairs: No      PT Goals (current goals can now be found in the care plan section) Acute Rehab PT Goals PT Goal Formulation: With patient Time For Goal Achievement: 07/05/13 Potential to Achieve Goals: Good  Visit Information  Last PT Received On: 07/01/13 Assistance Needed: +1 History of Present Illness: Pt fell at home sustaining L distal femur fx.  Pt underwent IM nailing yesterday, 06-27-12.    Subjective Data      Cognition  Cognition Arousal/Alertness: Awake/alert Behavior During Therapy: WFL for tasks assessed/performed Overall Cognitive Status: Within Functional Limits for tasks assessed    Balance     End of Session PT - End of Session Equipment Utilized During Treatment: Left knee immobilizer Activity Tolerance: Patient tolerated treatment well Patient left: in chair;with call bell/phone within reach Nurse Communication: Mobility status   GP     Lara MulchCooper, Maureen Ford 07/01/2013, 12:02 PM   Maureen Ford, PTA [phone] 07/01/2013

## 2013-07-01 NOTE — Discharge Instructions (Signed)
Diet: As you were doing prior to hospitalization   Shower:  May shower but keep the wounds dry, use an occlusive plastic wrap, NO SOAKING IN TUB.  If the bandage gets wet, change with a clean dry gauze.  Dressing:  You may change your dressing 3-5 days after surgery.  Then change the dressing daily with sterile 4"x4"s gauze dressing.  Use paper tape to hold dressing in place.   There are sticky tapes (steri-strips) on your wounds and all the stitches are absorbable.  Leave the steri-strips in place when changing your dressings, they will peel off with time, usually 2-3 weeks.  Activity:  Increase activity slowly as tolerated, but follow the weight bearing instructions below.  No lifting or driving for 6 weeks  Weight Bearing:   Touch toe weightbearing, left lower extremity.    To prevent constipation: you may use a stool softener such as -  Colace ( over the counter) 100 mg by mouth twice a day  Drink plenty of fluids ( prune juice may be helpful) and high fiber foods Miralax ( over the counter) for constipation as needed.    Itching:  If you experience itching with your medications, try taking only a single pain pill, or even half a pain pill at a time.  You may take up to 10 pain pills per day, and you can also use benadryl over the counter for itching or also to help with sleep.   Precautions:  If you experience chest pain or shortness of breath - call 911 immediately for transfer to the hospital emergency department!! If you develop a fever greater that 101 F, purulent drainage from wound, increased redness or drainage from wound, or calf pain -- Call the office at [phone]                                                Follow- Up Appointment:  Please call for an appointment to be seen in 2 weeks [place] - [phone]     Information on my medicine - XARELTO (rivaroxaban)  This medication education was reviewed with me or my healthcare representative as part of my  discharge preparation.  The pharmacist that spoke with me during my hospital stay was:  Lucia Gaskinsham, Asia Favata P, [facility]  WHY WAS XARELTO PRESCRIBED FOR YOU? Xarelto was prescribed to treat blood clots that may have been found in the veins of your legs (deep vein thrombosis) or in your lungs (pulmonary embolism) and to reduce the risk of them occurring again.  What do you need to know about Xarelto? The starting dose is one 15 mg tablet taken TWICE daily with food for the FIRST 21 DAYS then on 07/21/13  the dose is changed to one 20 mg tablet taken ONCE A DAY with your evening meal.  DO NOT stop taking Xarelto without talking to the health care provider who prescribed the medication.  Refill your prescription for 20 mg tablets before you run out.  After discharge, you should have regular check-up appointments with your healthcare provider that is prescribing your Xarelto.  In the future your dose may need to be changed if your kidney function changes by a significant amount.  What do you do if you miss a dose? If you are taking Xarelto TWICE DAILY and you miss a dose, take it as soon as you remember.  You may take two 15 mg tablets (total 30 mg) at the same time then resume your regularly scheduled 15 mg twice daily the next day.  If you are taking Xarelto ONCE DAILY and you miss a dose, take it as soon as you remember on the same day then continue your regularly scheduled once daily regimen the next day. Do not take two doses of Xarelto at the same time.   Important Safety Information Xarelto is a blood thinner medicine that can cause bleeding. You should call your healthcare provider right away if you experience any of the following:   Bleeding from an injury or your nose that does not stop.   Unusual colored urine (red or dark brown) or unusual colored stools (red or black).   Unusual bruising for unknown reasons.   A serious fall or if you hit your head (even if there is no bleeding).  Some  medicines may interact with Xarelto and might increase your risk of bleeding while on Xarelto. To help avoid this, consult your healthcare provider or pharmacist prior to using any new prescription or non-prescription medications, including herbals, vitamins, non-steroidal anti-inflammatory drugs (NSAIDs) and supplements.  This website has more information on Xarelto: VisitDestination.com.br.

## 2013-07-01 NOTE — Care Management Note (Signed)
07/01/13 11:00am Vance PeperSusan Hansford Hirt, RN BSN Case Manager  Patient has decided she will go to SNF, husband is not able to care for her at home.Social worker is aware.

## 2013-07-02 ENCOUNTER — Encounter (HOSPITAL_COMMUNITY): Payer: Self-pay | Admitting: Orthopedic Surgery

## 2013-10-24 DEATH — deceased

## 2014-09-08 IMAGING — RF DG C-ARM 61-120 MIN
1 series · 5 of 5 positions shown · non-contrast
Comparison: Left femur radiographs - 06/26/2013

FLUOROSCOPY TIME:  dictate in minutes & seconds

CLINICAL DATA: IM retrograde nail of the left femur

EXAM:
DG C-ARM 61-120 MIN; LEFT FEMUR - 2 VIEW

[Series 1: run · 5 of 5 slices shown]
[im 1/5]
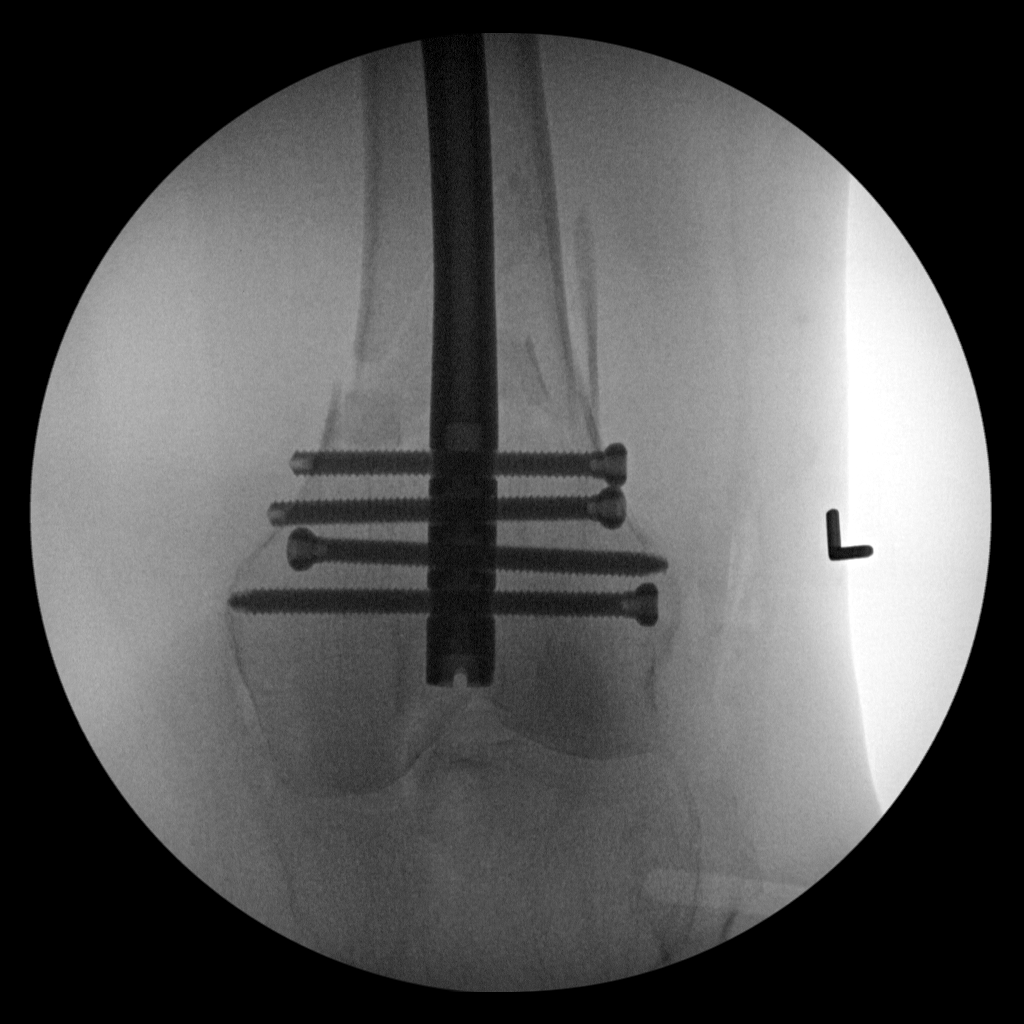
[im 2/5]
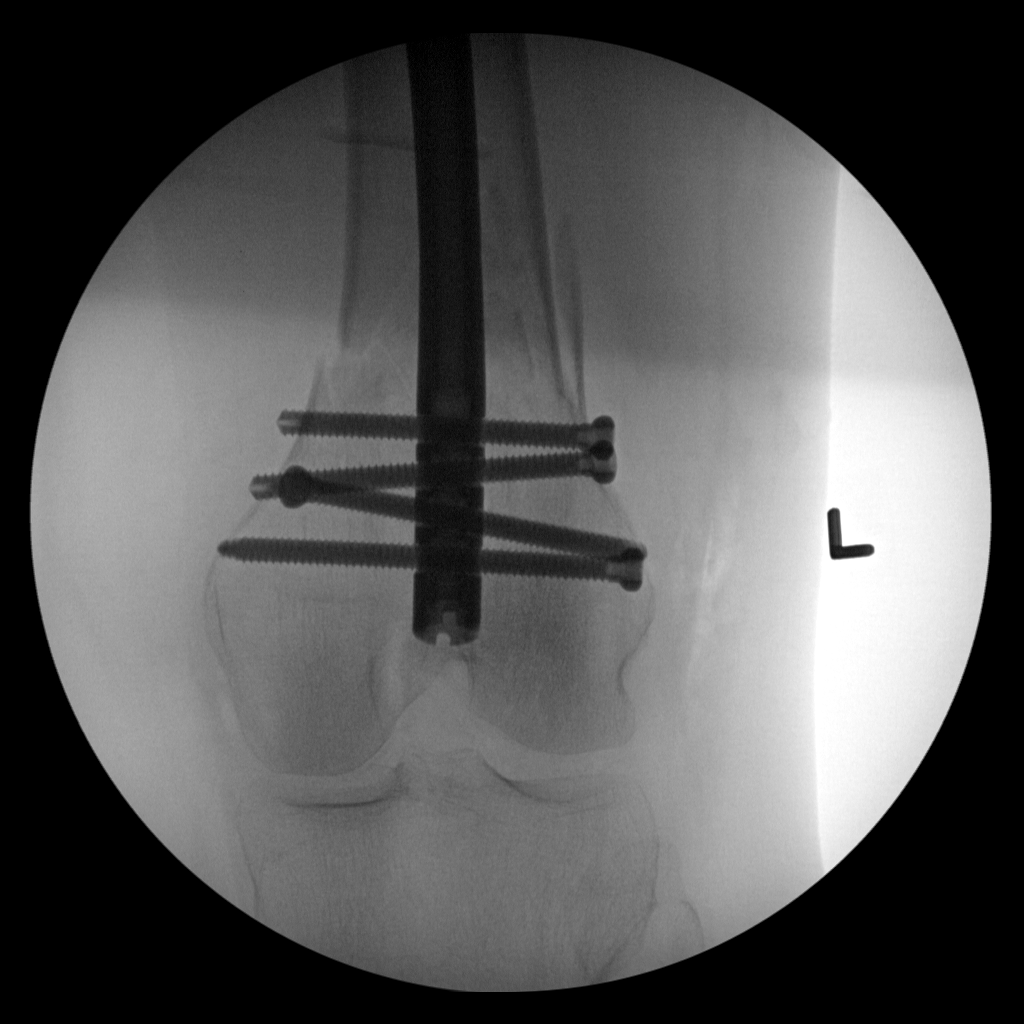
[im 3/5]
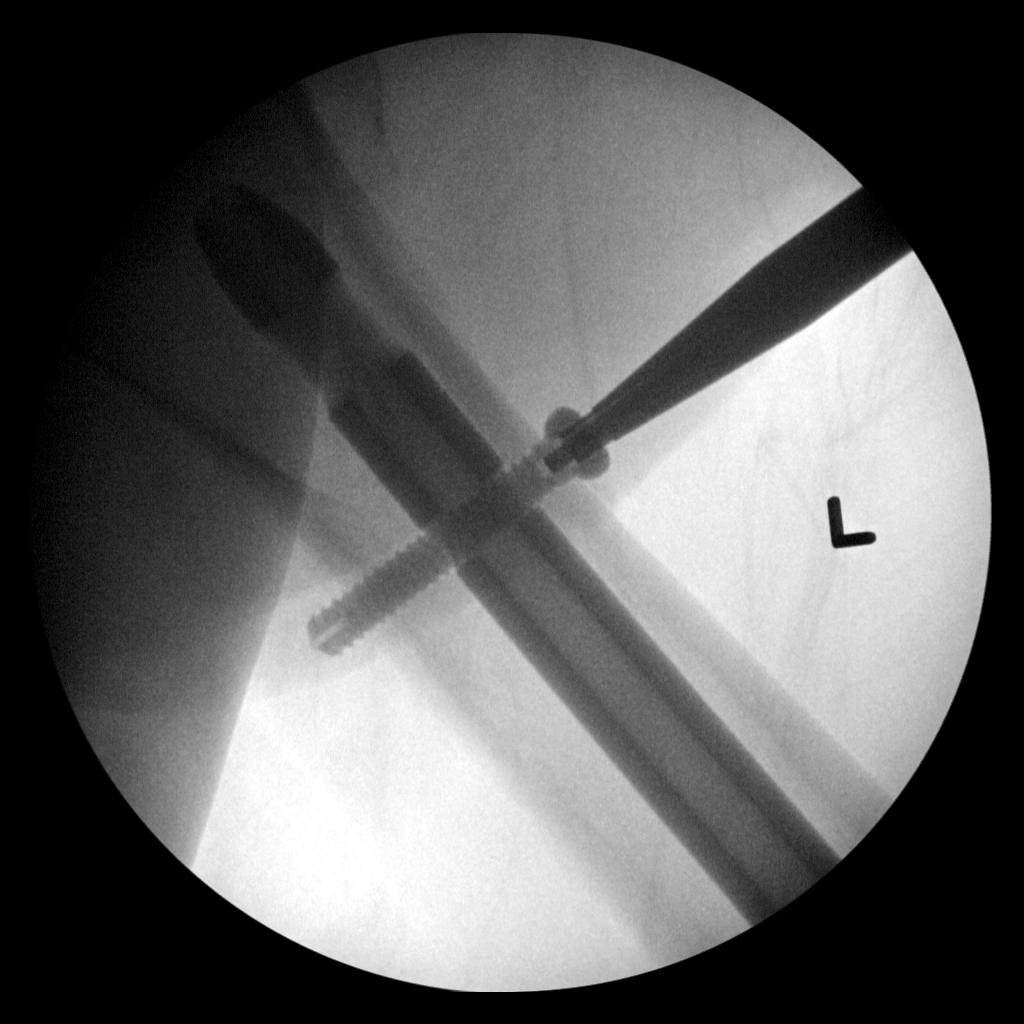
[im 4/5]
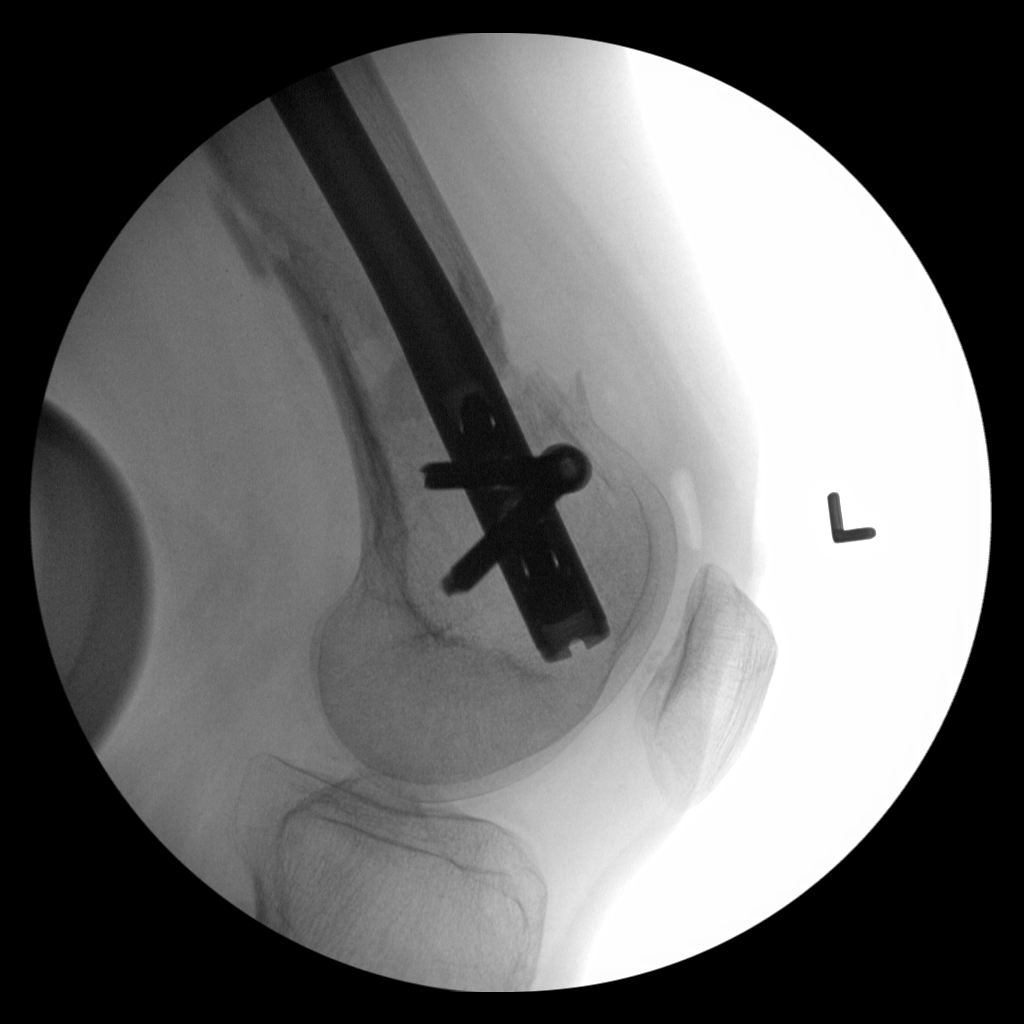
[im 5/5]
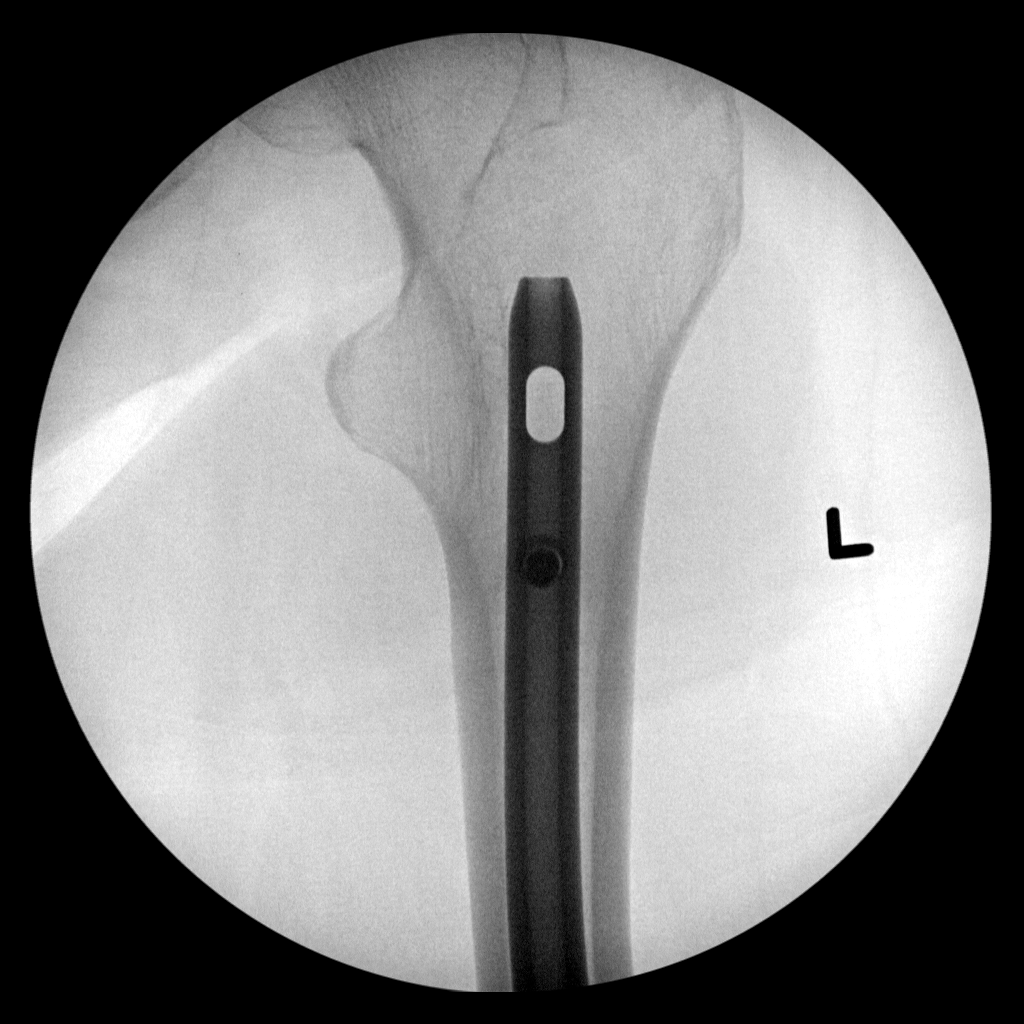

[5 of 5 positions shown; findings below may reference images not displayed]

FINDINGS: Five spot intraoperative fluoroscopic images of the left femur are
provided for review.

Images demonstrate the sequela of intra medullary rod fixation of
previously identified oblique, comminuted distal diaphyseal femur
fracture. The proximal end of the intra medullary rod is transfixed
with a cancellous screw. The distal end of the rod is transfixed
with 4 cancellous screws.

Alignment appears near anatomic.

There is expected subcutaneous emphysema about the operative site.
No definite radiopaque foreign body.
IMPRESSION: Post intra medullary rod fixation of comminuted distal femur
fracture without evidence of complication.
